# Patient Record
Sex: Male | Born: 1940 | Race: White | Hispanic: No | Marital: Single | Smoking: Never smoker
Health system: Southern US, Community
[De-identification: ages and names within clinical notes are randomized; demographics above are authoritative.]

## PROBLEM LIST (undated history)

## (undated) DIAGNOSIS — M792 Neuralgia and neuritis, unspecified: Secondary | ICD-10-CM

## (undated) DIAGNOSIS — I10 Essential (primary) hypertension: Secondary | ICD-10-CM

## (undated) DIAGNOSIS — M1990 Unspecified osteoarthritis, unspecified site: Secondary | ICD-10-CM

## (undated) DIAGNOSIS — M5030 Other cervical disc degeneration, unspecified cervical region: Secondary | ICD-10-CM

## (undated) DIAGNOSIS — S065X9A Traumatic subdural hemorrhage with loss of consciousness of unspecified duration, initial encounter: Secondary | ICD-10-CM

## (undated) DIAGNOSIS — I639 Cerebral infarction, unspecified: Secondary | ICD-10-CM

## (undated) DIAGNOSIS — E785 Hyperlipidemia, unspecified: Secondary | ICD-10-CM

## (undated) HISTORY — DX: Hyperlipidemia, unspecified: E78.5

## (undated) HISTORY — DX: Cerebral infarction, unspecified: I63.9

## (undated) HISTORY — PX: NASAL SINUS SURGERY: SHX719

## (undated) HISTORY — PX: LAMINECTOMY: SHX219

## (undated) HISTORY — DX: Other cervical disc degeneration, unspecified cervical region: M50.30

## (undated) HISTORY — DX: Traumatic subdural hemorrhage with loss of consciousness of unspecified duration, initial encounter: S06.5X9A

## (undated) HISTORY — PX: FOOT SURGERY: SHX648

## (undated) HISTORY — DX: Unspecified osteoarthritis, unspecified site: M19.90

---

## 2011-10-27 DIAGNOSIS — S065X9A Traumatic subdural hemorrhage with loss of consciousness of unspecified duration, initial encounter: Secondary | ICD-10-CM

## 2011-10-27 DIAGNOSIS — S065XAA Traumatic subdural hemorrhage with loss of consciousness status unknown, initial encounter: Secondary | ICD-10-CM

## 2011-10-27 HISTORY — DX: Traumatic subdural hemorrhage with loss of consciousness of unspecified duration, initial encounter: S06.5X9A

## 2011-10-27 HISTORY — DX: Traumatic subdural hemorrhage with loss of consciousness status unknown, initial encounter: S06.5XAA

## 2011-11-14 ENCOUNTER — Inpatient Hospital Stay (HOSPITAL_COMMUNITY)
Admission: AD | Admit: 2011-11-14 | Discharge: 2011-11-21 | DRG: 027 | Disposition: A | Discharge status: Home / Self Care | Payer: Medicare Other | Source: Other Acute Inpatient Hospital | Attending: Neurosurgery | Admitting: Neurosurgery

## 2011-11-14 ENCOUNTER — Inpatient Hospital Stay (HOSPITAL_COMMUNITY): Payer: Medicare Other

## 2011-11-14 ENCOUNTER — Encounter (HOSPITAL_COMMUNITY): Payer: Self-pay | Admitting: *Deleted

## 2011-11-14 DIAGNOSIS — I10 Essential (primary) hypertension: Secondary | ICD-10-CM | POA: Diagnosis present

## 2011-11-14 DIAGNOSIS — Z79899 Other long term (current) drug therapy: Secondary | ICD-10-CM

## 2011-11-14 DIAGNOSIS — I6200 Nontraumatic subdural hemorrhage, unspecified: Principal | ICD-10-CM | POA: Diagnosis present

## 2011-11-14 HISTORY — DX: Neuralgia and neuritis, unspecified: M79.2

## 2011-11-14 HISTORY — DX: Essential (primary) hypertension: I10

## 2011-11-14 LAB — COMPREHENSIVE METABOLIC PANEL
ALT: 17 U/L (ref 0–53)
Alkaline Phosphatase: 82 U/L (ref 39–117)
BUN: 13 mg/dL (ref 6–23)
CO2: 23 mEq/L (ref 19–32)
Calcium: 9.3 mg/dL (ref 8.4–10.5)
GFR calc Af Amer: >90 mL/min (ref >90)
GFR calc non Af Amer: 84 mL/min — ABNORMAL LOW (ref >90)
Glucose, Bld: 100 mg/dL — ABNORMAL HIGH (ref 70–99)
Sodium: 140 mEq/L (ref 135–145)
Total Protein: 6.9 g/dL (ref 6.0–8.3)

## 2011-11-14 LAB — PROTIME-INR: Prothrombin Time: 14.7 seconds (ref 11.6–15.2)

## 2011-11-14 LAB — CBC
MCH: 31.4 pg (ref 26.0–34.0)
MCHC: 34.7 g/dL (ref 30.0–36.0)
MCV: 90.3 fL (ref 78.0–100.0)
Platelets: 213 10*3/uL (ref 150–400)
RBC: 4.97 MIL/uL (ref 4.22–5.81)

## 2011-11-14 MED ORDER — ADULT MULTIVITAMIN W/MINERALS CH
1.0000 | ORAL_TABLET | Freq: Every day | ORAL | Status: DC
  Administered 2011-11-15 – 2011-11-21 (×7): 1 via ORAL
  Filled 2011-11-14 (×7): qty 1

## 2011-11-14 MED ORDER — GABAPENTIN 300 MG PO CAPS
300.0000 mg | ORAL_CAPSULE | Freq: Every day | ORAL | Status: DC
  Administered 2011-11-15 – 2011-11-21 (×7): 300 mg via ORAL
  Filled 2011-11-14 (×7): qty 1

## 2011-11-14 MED ORDER — MORPHINE SULFATE 2 MG/ML IJ SOLN
1.0000 mg | INTRAMUSCULAR | Status: DC | PRN
  Administered 2011-11-14 – 2011-11-15 (×2): 2 mg via INTRAVENOUS
  Filled 2011-11-14 (×2): qty 1

## 2011-11-14 MED ORDER — SODIUM CHLORIDE 0.9 % IV SOLN
INTRAVENOUS | Status: DC
  Administered 2011-11-14: 21:00:00 via INTRAVENOUS

## 2011-11-14 MED ORDER — ACETAMINOPHEN 650 MG RE SUPP: 650.0000 mg | RECTAL | Status: DC | PRN

## 2011-11-14 MED ORDER — ACETAMINOPHEN 325 MG PO TABS: 650.0000 mg | ORAL_TABLET | ORAL | Status: DC | PRN

## 2011-11-14 MED ORDER — LISINOPRIL 5 MG PO TABS
5.0000 mg | ORAL_TABLET | Freq: Every day | ORAL | Status: DC
  Administered 2011-11-15 – 2011-11-21 (×5): 5 mg via ORAL
  Filled 2011-11-14 (×7): qty 1

## 2011-11-14 NOTE — H&P (Signed)
Subjective: 71 yr old man with few days history of confusion - presented to ER - found to have bilateral SBH - no history of trauma - has had couple falls and dropping things from right hand last 2 days, also.  There are no active problems to display for this patient.  Past Medical History  Diagnosis Date  . Hypertension   . Nerve pain     History reviewed. No pertinent past surgical history.  No prescriptions prior to admission   No Known Allergies  History  Substance Use Topics  . Smoking status: Never Smoker   . Smokeless tobacco: Not on file  . Alcohol Use: Yes    History reviewed. No pertinent family history.  Review of Systems A comprehensive review of systems was negative.  Objective: Vital signs in last 24 hours:   normocepahalic AAOx3,  Follows most commands, but some confusion with multistep commands - some word confusion.   Motor - 5/5, no drift  CN II-XII intact , gait defered  Data ReviewCT head - bilat isodense SDH  Assessment/Plan: Pt with large bilaterall subacute SDH - admit to ICU - to OR for evacuation of SDH - procedure and possible risks and complications discussed with pt, including death, stroke , bleeding, infection, recurrence, among others.   Clydene Fake, MD 11/14/2011 8:30 PM

## 2011-11-15 ENCOUNTER — Encounter (HOSPITAL_COMMUNITY): Payer: Self-pay | Admitting: Anesthesiology

## 2011-11-15 ENCOUNTER — Inpatient Hospital Stay (HOSPITAL_COMMUNITY): Payer: Medicare Other | Admitting: Anesthesiology

## 2011-11-15 ENCOUNTER — Encounter (HOSPITAL_COMMUNITY)
Admission: AD | Disposition: A | Discharge status: Home / Self Care | Payer: Self-pay | Source: Other Acute Inpatient Hospital | Attending: Neurosurgery

## 2011-11-15 HISTORY — PX: CRANIOTOMY: SHX93

## 2011-11-15 LAB — URINALYSIS, ROUTINE W REFLEX MICROSCOPIC
Bilirubin Urine: NEGATIVE
Ketones, ur: 40 mg/dL — AB
Leukocytes, UA: NEGATIVE
Nitrite: NEGATIVE
Specific Gravity, Urine: 1.019 (ref 1.005–1.030)
Urobilinogen, UA: 0.2 mg/dL (ref 0.0–1.0)

## 2011-11-15 LAB — ABO/RH: ABO/RH(D): AB NEG

## 2011-11-15 SURGERY — CRANIOTOMY HEMATOMA EVACUATION SUBDURAL
Anesthesia: General | Site: Head | Wound class: Clean

## 2011-11-15 MED ORDER — LIDOCAINE-EPINEPHRINE 1 %-1:100000 IJ SOLN
INTRAMUSCULAR | Status: DC | PRN
  Administered 2011-11-15: 32.5 mL

## 2011-11-15 MED ORDER — SODIUM CHLORIDE 0.9 % IV SOLN
INTRAVENOUS | Status: DC | PRN
  Administered 2011-11-15: 11:00:00 via INTRAVENOUS

## 2011-11-15 MED ORDER — FLEET ENEMA 7-19 GM/118ML RE ENEM: 1.0000 | ENEMA | Freq: Once | RECTAL | Status: AC | PRN

## 2011-11-15 MED ORDER — ONDANSETRON HCL 4 MG/2ML IJ SOLN: 4.0000 mg | Freq: Four times a day (QID) | INTRAMUSCULAR | Status: DC | PRN

## 2011-11-15 MED ORDER — CEFAZOLIN SODIUM 1-5 GM-% IV SOLN
1.0000 g | Freq: Three times a day (TID) | INTRAVENOUS | Status: DC
  Administered 2011-11-15 – 2011-11-19 (×13): 1 g via INTRAVENOUS
  Filled 2011-11-15 (×14): qty 50

## 2011-11-15 MED ORDER — VECURONIUM BROMIDE 10 MG IV SOLR
INTRAVENOUS | Status: DC | PRN
  Administered 2011-11-15: 2 mg via INTRAVENOUS

## 2011-11-15 MED ORDER — HYDROCODONE-ACETAMINOPHEN 5-325 MG PO TABS
1.0000 | ORAL_TABLET | ORAL | Status: DC | PRN
  Administered 2011-11-15 – 2011-11-20 (×6): 1 via ORAL
  Filled 2011-11-15 (×6): qty 1

## 2011-11-15 MED ORDER — PHENYLEPHRINE HCL 10 MG/ML IJ SOLN
10.0000 mg | INTRAVENOUS | Status: DC | PRN
  Administered 2011-11-15: 1 ug/min via INTRAVENOUS

## 2011-11-15 MED ORDER — PROPOFOL 10 MG/ML IV BOLUS
INTRAVENOUS | Status: DC | PRN
  Administered 2011-11-15: 150 mg via INTRAVENOUS

## 2011-11-15 MED ORDER — DOCUSATE SODIUM 100 MG PO CAPS
100.0000 mg | ORAL_CAPSULE | Freq: Two times a day (BID) | ORAL | Status: DC
  Administered 2011-11-15 – 2011-11-20 (×10): 100 mg via ORAL
  Filled 2011-11-15 (×11): qty 1

## 2011-11-15 MED ORDER — HYDROMORPHONE HCL PF 1 MG/ML IJ SOLN
0.2500 mg | INTRAMUSCULAR | Status: DC | PRN
  Administered 2011-11-15: 0.5 mg via INTRAVENOUS

## 2011-11-15 MED ORDER — ONDANSETRON HCL 4 MG PO TABS: 4.0000 mg | ORAL_TABLET | ORAL | Status: DC | PRN

## 2011-11-15 MED ORDER — MORPHINE SULFATE 2 MG/ML IJ SOLN: 1.0000 mg | INTRAMUSCULAR | Status: DC | PRN

## 2011-11-15 MED ORDER — ONDANSETRON HCL 4 MG/2ML IJ SOLN: 4.0000 mg | INTRAMUSCULAR | Status: DC | PRN

## 2011-11-15 MED ORDER — SODIUM CHLORIDE 0.9 % IV SOLN
500.0000 mg | Freq: Two times a day (BID) | INTRAVENOUS | Status: DC
  Administered 2011-11-15 – 2011-11-16 (×3): 500 mg via INTRAVENOUS
  Filled 2011-11-15 (×4): qty 5

## 2011-11-15 MED ORDER — THROMBIN 20000 UNITS EX KIT
PACK | CUTANEOUS | Status: DC | PRN
  Administered 2011-11-15: 12:00:00 via TOPICAL

## 2011-11-15 MED ORDER — NEOSTIGMINE METHYLSULFATE 1 MG/ML IJ SOLN
INTRAMUSCULAR | Status: DC | PRN
  Administered 2011-11-15: 4 mg via INTRAVENOUS

## 2011-11-15 MED ORDER — LABETALOL HCL 5 MG/ML IV SOLN
INTRAVENOUS | Status: DC | PRN
  Administered 2011-11-15 (×2): 10 mg via INTRAVENOUS

## 2011-11-15 MED ORDER — LABETALOL HCL 5 MG/ML IV SOLN: 10.0000 mg | INTRAVENOUS | Status: DC | PRN

## 2011-11-15 MED ORDER — MICROFIBRILLAR COLL HEMOSTAT EX PADS
MEDICATED_PAD | CUTANEOUS | Status: DC | PRN
  Administered 2011-11-15: 1 via TOPICAL

## 2011-11-15 MED ORDER — FENTANYL CITRATE 0.05 MG/ML IJ SOLN
INTRAMUSCULAR | Status: DC | PRN
  Administered 2011-11-15: 25 ug via INTRAVENOUS
  Administered 2011-11-15: 50 ug via INTRAVENOUS
  Administered 2011-11-15: 25 ug via INTRAVENOUS

## 2011-11-15 MED ORDER — ACETAMINOPHEN 325 MG PO TABS
650.0000 mg | ORAL_TABLET | ORAL | Status: DC | PRN
  Administered 2011-11-17 – 2011-11-20 (×6): 650 mg via ORAL
  Filled 2011-11-15 (×6): qty 2

## 2011-11-15 MED ORDER — GLYCOPYRROLATE 0.2 MG/ML IJ SOLN
INTRAMUSCULAR | Status: DC | PRN
  Administered 2011-11-15: .8 mg via INTRAVENOUS

## 2011-11-15 MED ORDER — ACETAMINOPHEN 650 MG RE SUPP: 650.0000 mg | RECTAL | Status: DC | PRN

## 2011-11-15 MED ORDER — KCL IN DEXTROSE-NACL 20-5-0.45 MEQ/L-%-% IV SOLN
INTRAVENOUS | Status: DC
  Administered 2011-11-15 – 2011-11-16 (×3): via INTRAVENOUS
  Filled 2011-11-15 (×6): qty 1000

## 2011-11-15 MED ORDER — ROCURONIUM BROMIDE 100 MG/10ML IV SOLN
INTRAVENOUS | Status: DC | PRN
  Administered 2011-11-15: 50 mg via INTRAVENOUS

## 2011-11-15 MED ORDER — BISACODYL 10 MG RE SUPP: 10.0000 mg | Freq: Every day | RECTAL | Status: DC | PRN

## 2011-11-15 MED ORDER — PANTOPRAZOLE SODIUM 40 MG IV SOLR
40.0000 mg | Freq: Every day | INTRAVENOUS | Status: DC
  Administered 2011-11-15 – 2011-11-17 (×3): 40 mg via INTRAVENOUS
  Filled 2011-11-15 (×4): qty 40

## 2011-11-15 MED ORDER — CEFAZOLIN SODIUM 1-5 GM-% IV SOLN
INTRAVENOUS | Status: AC
  Administered 2011-11-15: 1 g via INTRAVENOUS
  Filled 2011-11-15: qty 50

## 2011-11-15 MED ORDER — ONDANSETRON HCL 4 MG/2ML IJ SOLN
INTRAMUSCULAR | Status: DC | PRN
  Administered 2011-11-15: 4 mg via INTRAVENOUS

## 2011-11-15 MED ORDER — PROMETHAZINE HCL 25 MG PO TABS: 12.5000 mg | ORAL_TABLET | ORAL | Status: DC | PRN

## 2011-11-15 MED ORDER — MAGNESIUM HYDROXIDE 400 MG/5ML PO SUSP: 30.0000 mL | Freq: Every day | ORAL | Status: DC | PRN

## 2011-11-15 SURGICAL SUPPLY — 68 items
BAG DECANTER FOR FLEXI CONT (MISCELLANEOUS) ×2 IMPLANT
BANDAGE GAUZE 4  KLING STR (GAUZE/BANDAGES/DRESSINGS) IMPLANT
BANDAGE GAUZE ELAST BULKY 4 IN (GAUZE/BANDAGES/DRESSINGS) IMPLANT
BIT DRILL WIRE PASS 1.3MM (BIT) IMPLANT
BLADE CLIPPER SURG NEURO (BLADE) IMPLANT
BRUSH SCRUB EZ PLAIN DRY (MISCELLANEOUS) ×1 IMPLANT
BUR ACORN 6.0 PRECISION (BURR) ×2 IMPLANT
BUR ROUTER D-58 CRANI (BURR) IMPLANT
CANISTER SUCTION 2500CC (MISCELLANEOUS) ×2 IMPLANT
CLIP TI MEDIUM 6 (CLIP) IMPLANT
CLOTH BEACON ORANGE TIMEOUT ST (SAFETY) ×2 IMPLANT
CONT SPEC 4OZ CLIKSEAL STRL BL (MISCELLANEOUS) ×2 IMPLANT
CORDS BIPOLAR (ELECTRODE) ×2 IMPLANT
DRAIN JACKSON PRATT 10MM FLAT (MISCELLANEOUS) ×2 IMPLANT
DRAPE NEUROLOGICAL W/INCISE (DRAPES) ×2 IMPLANT
DRAPE SURG 17X23 STRL (DRAPES) IMPLANT
DRAPE WARM FLUID 44X44 (DRAPE) ×2 IMPLANT
DRESSING TELFA 8X3 (GAUZE/BANDAGES/DRESSINGS) ×1 IMPLANT
DRILL WIRE PASS 1.3MM (BIT)
DRSG OPSITE 4X5.5 SM (GAUZE/BANDAGES/DRESSINGS) ×4 IMPLANT
ELECT CAUTERY BLADE 6.4 (BLADE) ×2 IMPLANT
ELECT NDL TIP 2.8 STRL (NEEDLE) ×1 IMPLANT
ELECT NEEDLE TIP 2.8 STRL (NEEDLE) ×2 IMPLANT
ELECT REM PT RETURN 9FT ADLT (ELECTROSURGICAL) ×2
ELECTRODE REM PT RTRN 9FT ADLT (ELECTROSURGICAL) ×1 IMPLANT
EVACUATOR SILICONE 100CC (DRAIN) ×2 IMPLANT
GAUZE SPONGE 4X4 16PLY XRAY LF (GAUZE/BANDAGES/DRESSINGS) IMPLANT
GLOVE BIOGEL M 7.0 STRL (GLOVE) ×1 IMPLANT
GLOVE BIOGEL M 8.0 STRL (GLOVE) ×1 IMPLANT
GLOVE ECLIPSE 7.5 STRL STRAW (GLOVE) ×2 IMPLANT
GLOVE EXAM NITRILE LRG STRL (GLOVE) IMPLANT
GLOVE EXAM NITRILE MD LF STRL (GLOVE) ×1 IMPLANT
GLOVE EXAM NITRILE XL STR (GLOVE) IMPLANT
GLOVE EXAM NITRILE XS STR PU (GLOVE) IMPLANT
GLOVE INDICATOR 7.5 STRL GRN (GLOVE) ×1 IMPLANT
GOWN BRE IMP SLV AUR LG STRL (GOWN DISPOSABLE) ×3 IMPLANT
GOWN BRE IMP SLV AUR XL STRL (GOWN DISPOSABLE) ×1 IMPLANT
GOWN STRL REIN 2XL LVL4 (GOWN DISPOSABLE) IMPLANT
HOOK DURA (MISCELLANEOUS) ×1 IMPLANT
KIT BASIN OR (CUSTOM PROCEDURE TRAY) ×2 IMPLANT
KIT ROOM TURNOVER OR (KITS) ×2 IMPLANT
NEEDLE HYPO 22GX1.5 SAFETY (NEEDLE) ×2 IMPLANT
NS IRRIG 1000ML POUR BTL (IV SOLUTION) ×3 IMPLANT
PACK CRANIOTOMY (CUSTOM PROCEDURE TRAY) ×2 IMPLANT
PAD ARMBOARD 7.5X6 YLW CONV (MISCELLANEOUS) ×4 IMPLANT
PATTIES SURGICAL .25X.25 (GAUZE/BANDAGES/DRESSINGS) IMPLANT
PATTIES SURGICAL .5 X.5 (GAUZE/BANDAGES/DRESSINGS) IMPLANT
PATTIES SURGICAL .5 X3 (DISPOSABLE) IMPLANT
PATTIES SURGICAL .75X.75 (GAUZE/BANDAGES/DRESSINGS) ×2 IMPLANT
PATTIES SURGICAL 1X1 (DISPOSABLE) IMPLANT
PERFORATOR LRG  14-11MM (BIT) ×1
PERFORATOR LRG 14-11MM (BIT) IMPLANT
PIN MAYFIELD SKULL DISP (PIN) IMPLANT
SPECIMEN JAR SMALL (MISCELLANEOUS) IMPLANT
SPONGE GAUZE 4X4 12PLY (GAUZE/BANDAGES/DRESSINGS) IMPLANT
SPONGE NEURO XRAY DETECT 1X3 (DISPOSABLE) IMPLANT
STAPLER SKIN PROX WIDE 3.9 (STAPLE) ×2 IMPLANT
SUT ETHILON 3 0 FSL (SUTURE) IMPLANT
SUT NURALON 4 0 TR CR/8 (SUTURE) ×4 IMPLANT
SUT VIC AB 2-0 CP2 18 (SUTURE) ×4 IMPLANT
SYR 20ML ECCENTRIC (SYRINGE) ×2 IMPLANT
SYR CONTROL 10ML LL (SYRINGE) ×2 IMPLANT
TAPE PAPER MEDFIX 1IN X 10YD (GAUZE/BANDAGES/DRESSINGS) IMPLANT
TOWEL OR 17X24 6PK STRL BLUE (TOWEL DISPOSABLE) ×1 IMPLANT
TOWEL OR 17X26 10 PK STRL BLUE (TOWEL DISPOSABLE) ×2 IMPLANT
TRAY FOLEY CATH 14FRSI W/METER (CATHETERS) ×1 IMPLANT
UNDERPAD 30X30 INCONTINENT (UNDERPADS AND DIAPERS) ×1 IMPLANT
WATER STERILE IRR 1000ML POUR (IV SOLUTION) ×2 IMPLANT

## 2011-11-15 NOTE — Anesthesia Preprocedure Evaluation (Signed)
Anesthesia Evaluation  Patient identified by MRN, date of birth, ID band Patient awake    Reviewed: Allergy & Precautions, H&P , NPO status , Patient's Chart, lab work & pertinent test results  Airway Mallampati: II  Neck ROM: full    Dental   Pulmonary          Cardiovascular hypertension,     Neuro/Psych  Neuromuscular disease    GI/Hepatic   Endo/Other    Renal/GU      Musculoskeletal   Abdominal   Peds  Hematology   Anesthesia Other Findings   Reproductive/Obstetrics                           Anesthesia Physical Anesthesia Plan  ASA: II  Anesthesia Plan: General   Post-op Pain Management:    Induction: Intravenous  Airway Management Planned: Oral ETT  Additional Equipment:   Intra-op Plan:   Post-operative Plan: Extubation in OR  Informed Consent: I have reviewed the patients History and Physical, chart, labs and discussed the procedure including the risks, benefits and alternatives for the proposed anesthesia with the patient or authorized representative who has indicated his/her understanding and acceptance.     Plan Discussed with: CRNA and Surgeon  Anesthesia Plan Comments:         Anesthesia Quick Evaluation

## 2011-11-15 NOTE — Preoperative (Signed)
Beta Blockers   Reason not to administer Beta Blockers:Not Applicable 

## 2011-11-15 NOTE — Anesthesia Postprocedure Evaluation (Signed)
Anesthesia Post Note  Patient: Johnathan Reynolds  Procedure(s) Performed: Procedure(s) (LRB): CRANIOTOMY HEMATOMA EVACUATION SUBDURAL (N/A)  Anesthesia type: General  Patient location: PACU  Post pain: Pain level controlled and Adequate analgesia  Post assessment: Post-op Vital signs reviewed, Patient's Cardiovascular Status Stable, Respiratory Function Stable, Patent Airway and Pain level controlled  Last Vitals:  Filed Vitals:   11/15/11 1000  BP:   Pulse: 77  Temp:   Resp: 15    Post vital signs: Reviewed and stable  Level of consciousness: awake, alert  and oriented  Complications: No apparent anesthesia complications

## 2011-11-15 NOTE — Transfer of Care (Signed)
Immediate Anesthesia Transfer of Care Note  Patient: Johnathan Reynolds  Procedure(s) Performed: Procedure(s) (LRB): CRANIOTOMY HEMATOMA EVACUATION SUBDURAL (N/A)  Patient Location: PACU  Anesthesia Type: General  Level of Consciousness: awake and alert   Airway & Oxygen Therapy: Patient Spontanous Breathing and Patient connected to nasal cannula oxygen  Post-op Assessment: Report given to PACU RN and Post -op Vital signs reviewed and stable  Post vital signs: Reviewed and stable  Complications: No apparent anesthesia complications

## 2011-11-15 NOTE — Op Note (Signed)
11/14/2011 - 11/15/2011  12:38 PM  PATIENT:  Johnathan Reynolds  71 y.o. male  PRE-OPERATIVE DIAGNOSIS:  subdural hematoma, bilateral  POST-OPERATIVE DIAGNOSIS:  subdural hematoma, bilateral  PROCEDURE:  Procedure(s): Bilateral burr hole evacuation SUBDURAL hematoma  SURGEON:  Surgeon(s): Clydene Fake, MD Karn Cassis, MD-ASSISTANT    ANESTHESIA:   general  EBL:  Total I/O In: 325 [I.V.:325] Out: -   BLOOD ADMINISTERED:none  DRAINS: () Jackson-Pratt drain(s) with closed bulb suction in the subgaleal space   SPECIMEN:  No Specimen  DICTATION: Patient has been having confusion some weakness CT done showing bilateral isodense subdural hematomas patient brought in for evacuation of hematoma.  Patient brought in the operating room general anesthesia induced patient was prepped draped sterile fashion. 4 incisions were made paramedian to on the right than the left one anterior one posterior taken down to the skull hemostasis obtained with Bovie cauterization. 4 bur holes were made in each of the incisions. Dura was coagulated with bipolar and dura opened with a 15 blade and the dural leaves Cora unit with bipolar. Dark blood was came out term CT to the little holes and we irrigated until the fluid was clear. Neck in the well-defined inner membrane which reopened with a microhook and each of the spots and then irrigated further of fluid rehydration under the inner membrane is remote to irrigated out and blood products. JP drain was brought out when he decides her substernal incision the JP drains were left in the subgaleal space over the bur holes. Galea closed with 2-0 Vicryl interrupted sutures the 4 spots skin closed with staples as sutures used to anchor the drains drinker connected to JP bulbs dressing was placed patient (and transferred to recovery.  PLAN OF CARE: Admit to inpatient   PATIENT DISPOSITION:  ICU - extubated and stable.

## 2011-11-16 MED ORDER — LEVETIRACETAM 500 MG PO TABS
500.0000 mg | ORAL_TABLET | Freq: Two times a day (BID) | ORAL | Status: DC
  Administered 2011-11-16 – 2011-11-21 (×10): 500 mg via ORAL
  Filled 2011-11-16 (×11): qty 1

## 2011-11-16 NOTE — Progress Notes (Signed)
Subjective: Patient reports no problems  Objective: Vital signs in last 24 hours: Temp:  [97 F (36.1 C)-98.1 F (36.7 C)] 97.7 F (36.5 C) (04/21 0410) Pulse Rate:  [42-83] 60  (04/21 0600) Resp:  [10-24] 10  (04/21 0600) BP: (96-145)/(56-81) 105/63 mmHg (04/21 0600) SpO2:  [94 %-100 %] 96 % (04/21 0600) Arterial Line BP: (120-168)/(54-90) 139/54 mmHg (04/20 1700) Weight:  [74.6 kg (164 lb 7.4 oz)] 74.6 kg (164 lb 7.4 oz) (04/21 0500)  Intake/Output from previous day: 04/20 0701 - 04/21 0700 In: 4433.3 [P.O.:560; I.V.:3438.3; IV Piggyback:310] Out: 3030 [Urine:2790; Drains:240] Intake/Output this shift: Total I/O In: 1466.7 [P.O.:240; I.V.:1071.7; IV Piggyback:155] Out: 2135 [Urine:2025; Drains:110]  PE - AAOx3, FC all 4 and moves all 4 well, seems improved mentation from preop, speech fluent Incisions - c/d/i  Lab Results:  Basename 11/14/11 2021  WBC 6.0  HGB 15.6  HCT 44.9  PLT 213   BMET  Basename 11/14/11 2021  NA 140  K 4.2  CL 107  CO2 23  GLUCOSE 100*  BUN 13  CREATININE 0.88  CALCIUM 9.3    Studies/Results: Dg Chest 2 View  11/15/2011  *RADIOLOGY REPORT*  Clinical Data: Preop  CHEST - 2 VIEW  Comparison: None.  Findings: Lungs are clear. No pleural effusion or pneumothorax.  Cardiomediastinal silhouette is within normal limits.  Degenerative changes of the visualized thoracolumbar spine.  IMPRESSION: No evidence of acute cardiopulmonary disease.  Original Report Authenticated By: Charline Bills, M.D.    Assessment/Plan: Pt doing well post-op - bedrest at least another day  LOS: 2 days     Clydene Fake, MD 11/16/2011, 6:56 AM

## 2011-11-16 NOTE — Plan of Care (Signed)
Problem: Consults Goal: Diagnosis - Craniotomy Outcome: Completed/Met Date Met:  11/16/11 SDH

## 2011-11-17 ENCOUNTER — Encounter (HOSPITAL_COMMUNITY): Payer: Self-pay | Admitting: Neurosurgery

## 2011-11-17 MED FILL — Hydromorphone HCl Inj 1 MG/ML: INTRAMUSCULAR | Qty: 1 | Status: AC

## 2011-11-17 NOTE — Progress Notes (Signed)
Subjective: Patient reports no c/o  Objective: Vital signs in last 24 hours: Temp:  [97.5 F (36.4 C)-98.6 F (37 C)] 97.9 F (36.6 C) (04/22 0700) Pulse Rate:  [52-75] 75  (04/22 1000) Resp:  [11-18] 14  (04/22 1000) BP: (107-134)/(55-87) 124/71 mmHg (04/22 1000) SpO2:  [95 %-100 %] 98 % (04/22 1000) Weight:  [73.3 kg (161 lb 9.6 oz)] 73.3 kg (161 lb 9.6 oz) (04/22 0400)  Intake/Output from previous day: 04/21 0701 - 04/22 0700 In: 2208.3 [P.O.:950; I.V.:1003.3; IV Piggyback:255] Out: 3265 [Urine:2955; Drains:310] Intake/Output this shift: Total I/O In: 20 [I.V.:20] Out: 475 [Urine:425; Drains:50]  Wound:c/d/i , drains working - intact Neuro  - no drift, AAOx3  Lab Results:  Basename 11/14/11 2021  WBC 6.0  HGB 15.6  HCT 44.9  PLT 213   BMET  Basename 11/14/11 2021  NA 140  K 4.2  CL 107  CO2 23  GLUCOSE 100*  BUN 13  CREATININE 0.88  CALCIUM 9.3    Studies/Results: No results found.  Assessment/Plan: Pt stavble - increase activity ,   LOS: 3 days     Jarissa Sheriff R, MD 11/17/2011, 10:58 AM

## 2011-11-18 LAB — TYPE AND SCREEN
Antibody Screen: NEGATIVE
Unit division: 0

## 2011-11-18 MED ORDER — PANTOPRAZOLE SODIUM 40 MG PO TBEC
40.0000 mg | DELAYED_RELEASE_TABLET | Freq: Every day | ORAL | Status: DC
  Administered 2011-11-18 – 2011-11-21 (×4): 40 mg via ORAL
  Filled 2011-11-18 (×5): qty 1

## 2011-11-18 NOTE — Progress Notes (Signed)
Utilization review completed. Jasenia Weilbacher, RN, BSN.  11/18/11  

## 2011-11-18 NOTE — Progress Notes (Signed)
Subjective: Patient has been uip in room, no c/o  Objective: Vital signs in last 24 hours: Temp:  [97.1 F (36.2 C)-98.4 F (36.9 C)] 97.5 F (36.4 C) (04/23 0800) Pulse Rate:  [54-105] 65  (04/23 1100) Resp:  [13-22] 15  (04/23 1100) BP: (85-132)/(58-92) 122/83 mmHg (04/23 1000) SpO2:  [93 %-100 %] 97 % (04/23 1100) Weight:  [72 kg (158 lb 11.7 oz)] 72 kg (158 lb 11.7 oz) (04/23 0600)  Intake/Output from previous day: 04/22 0701 - 04/23 0700 In: 482 [I.V.:240; IV Piggyback:150] Out: 2585 [Urine:2450; Drains:135] Intake/Output this shift: Total I/O In: 80 [I.V.:80] Out: 175 [Urine:175]  Wound:c/d/i -  Drains removed  Lab Results: No results found for this basename: WBC:2,HGB:2,HCT:2,PLT:2 in the last 72 hours BMET No results found for this basename: NA:2,K:2,CL:2,CO2:2,GLUCOSE:2,BUN:2,CREATININE:2,CALCIUM:2 in the last 72 hours  Studies/Results: No results found.  Assessment/Plan: Pt doing well  - increase activity - CT in am, Trans to floor in am?  LOS: 4 days     Johnathan Reynolds R, MD 11/18/2011, 11:17 AM

## 2011-11-19 ENCOUNTER — Encounter (HOSPITAL_COMMUNITY): Payer: Self-pay | Admitting: Radiology

## 2011-11-19 ENCOUNTER — Inpatient Hospital Stay (HOSPITAL_COMMUNITY): Payer: Medicare Other

## 2011-11-19 NOTE — Evaluation (Signed)
Physical Therapy Evaluation Patient Details Name: Johnathan Reynolds MRN: [id number] DOB: 05/09/1941 Today's Date: 11/19/2011 Time: 4098-1191 PT Time Calculation (min): 33 min  PT Assessment / Plan / Recommendation Clinical Impression  Pt is a 71 y.o. male admitted s/p a carniotomy for evacuation of a subdural hematoma with resulting decreased mobility, impaired balance, and impaired ambulation.  Patient will benefit from skilled physical therapy in the acute setting to address these impairments and prepare for d/c home with assist from family.    PT Assessment  Patient needs continued PT services    Follow Up Recommendations  Supervision/Assistance - 24 hour    Equipment Recommendations  None recommended by PT    Frequency Min 4X/week    Precautions / Restrictions Precautions Precautions: Fall Restrictions Weight Bearing Restrictions: No   Pertinent Vitals/Pain       Mobility  Bed Mobility Bed Mobility: Not assessed (Pt up in chair upon OT arrival) Transfers Transfers: Sit to Stand;Stand to Sit Sit to Stand: 5: Supervision;From chair/3-in-1;With armrests;With upper extremity assist Stand to Sit: 5: Supervision;With armrests;With upper extremity assist;To chair/3-in-1 Details for Transfer Assistance: Pt required supervision for safety with VC for safe hand placement. Ambulation/Gait Ambulation/Gait Assistance: 4: Min guard Ambulation Distance (Feet): 150 Feet Assistive device: None Ambulation/Gait Assistance Details: Pt required min guard assist for safety.  Pt is fearful/apprehensive about falling and takes slow, small steps. Gait Pattern: Step-to pattern;Decreased stride length;Shuffle;Narrow base of support Gait velocity: Decreased Stairs: No    Exercises     PT Goals Acute Rehab PT Goals PT Goal Formulation: With patient Time For Goal Achievement: 11/26/11 Potential to Achieve Goals: Good Pt will go Supine/Side to Sit: Independently PT Goal: Supine/Side to Sit -  Progress: Goal set today Pt will go Sit to Supine/Side: Independently PT Goal: Sit to Supine/Side - Progress: Goal set today Pt will go Sit to Stand: Independently PT Goal: Sit to Stand - Progress: Goal set today Pt will go Stand to Sit: Independently PT Goal: Stand to Sit - Progress: Goal set today Pt will Ambulate: >150 feet;with modified independence PT Goal: Ambulate - Progress: Goal set today  Visit Information  Last PT Received On: 11/19/11 Assistance Needed: +1 PT/OT Co-Evaluation/Treatment: Yes    Subjective Data  Subjective: 2/10 headache   Prior Functioning  Home Living Lives With: Alone Available Help at Discharge: Family;Available PRN/intermittently;Available 24 hours/day (nephew 24/7 initially) Type of Home: Mobile home Home Access: Ramped entrance Home Layout: One level Bathroom Shower/Tub: Tub/shower unit;Walk-in shower Bathroom Toilet: Standard Bathroom Accessibility: Yes How Accessible: Accessible via walker Home Adaptive Equipment: Shower chair without back;Bedside commode/3-in-1 Prior Function Level of Independence: Independent Able to Take Stairs?: Yes Driving: Yes Vocation: Retired Musician: No difficulties Dominant Hand: Right    Cognition  Overall Cognitive Status: Appears within functional limits for tasks assessed/performed Arousal/Alertness: Awake/alert Orientation Level: Oriented X4 / Intact Behavior During Session: WFL for tasks performed    Extremity/Trunk Assessment Right Upper Extremity Assessment RUE ROM/Strength/Tone: Within functional levels RUE Sensation: WFL - Light Touch;WFL - Proprioception RUE Coordination: WFL - gross/fine motor Left Upper Extremity Assessment LUE ROM/Strength/Tone: Within functional levels LUE Sensation: WFL - Light Touch;WFL - Proprioception LUE Coordination: WFL - gross/fine motor Right Lower Extremity Assessment RLE ROM/Strength/Tone: Within functional levels RLE Sensation: WFL -  Light Touch;WFL - Proprioception RLE Coordination: WFL - gross/fine motor Left Lower Extremity Assessment LLE ROM/Strength/Tone: Within functional levels LLE Sensation: WFL - Light Touch;WFL - Proprioception LLE Coordination: WFL - gross/fine motor Trunk Assessment Trunk  Assessment: Normal   Balance Balance Balance Assessed: Yes Static Sitting Balance Static Sitting - Balance Support: Bilateral upper extremity supported;Feet supported Static Sitting - Level of Assistance: 7: Independent Static Standing Balance Static Standing - Balance Support: During functional activity;No upper extremity supported Static Standing - Level of Assistance: Other (comment) (Min guard) Static Standing - Comment/# of Minutes: Pt required min guard assist for safety while brushing his teeth.  End of Session PT - End of Session Equipment Utilized During Treatment: Gait belt Activity Tolerance: Patient tolerated treatment well Patient left: in chair;with call bell/phone within reach Nurse Communication: Mobility status   Ezzard Standing SPT 11/19/2011, 2:37 PM

## 2011-11-19 NOTE — Progress Notes (Signed)
Subjective: Patient up incahair, eating no c/o  Objective: Vital signs in last 24 hours: Temp:  [97.5 F (36.4 C)-98.7 F (37.1 C)] 98.7 F (37.1 C) (04/24 0700) Pulse Rate:  [65-92] 82  (04/24 0800) Resp:  [13-20] 16  (04/24 0800) BP: (94-132)/(61-93) 120/84 mmHg (04/24 0800) SpO2:  [95 %-100 %] 97 % (04/24 0800)  Intake/Output from previous day: 04/23 0701 - 04/24 0700 In: 1230 [P.O.:600; I.V.:480; IV Piggyback:150] Out: 1540 [Urine:1475; Drains:65] Intake/Output this shift: Total I/O In: 20 [I.V.:20] Out: -   Wound:c/d/i AAOx3,   Lab Results: No results found for this basename: WBC:2,HGB:2,HCT:2,PLT:2 in the last 72 hours BMET No results found for this basename: NA:2,K:2,CL:2,CO2:2,GLUCOSE:2,BUN:2,CREATININE:2,CALCIUM:2 in the last 72 hours  Studies/Results: Ct Head Wo Contrast  11/19/2011  *RADIOLOGY REPORT*  Clinical Data: Follow-up surgery.  Subdural hematoma.  No acute complaints.  CT HEAD WITHOUT CONTRAST  Technique:  Contiguous axial images were obtained from the base of the skull through the vertex without contrast.  Comparison: None.  Findings: Bilateral subdural hematomas extending from the anterior frontal through to the posterior parietal regions.  Hematomas appear mostly low attenuation consistent with chronic hematomas with focal areas of increased attenuation consistent with acute on chronic hematomas.  Postoperative changes with bilateral frontal cranial ostomy is.  Gas collections in the subdural spaces and subcutaneous fat consistent with postoperative change.  Skin clips are present in the subcutaneous tissues.  Maximal depth of the right subdural hematoma is about 9 mm.  Depth of the left subdural hematoma is about 9 mm.  Mild mass effect with effacement of sulci. No midline shift.  Diffuse cerebral atrophy.  No ventricular dilatation.  Gray-white matter junctions are distinct.  Basal cisterns are not effaced.  Mucosal membrane thickening in the ethmoid air  cells and maxillary antra.  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  Original Report Authenticated By: Marlon Pel, M.D.    Assessment/Plan: CT shows residual SDcoll3ections as expected - will Transfer to floor , ?d/c in am  LOS: 5 days     Johnathan Reynolds R, MD 11/19/2011, 8:37 AM

## 2011-11-19 NOTE — Evaluation (Signed)
Agree with student PT evaluation.  Alwyn Cordner, PT DPT [phone]  

## 2011-11-19 NOTE — Progress Notes (Signed)
Chaplain Note:  Chaplain visited pt, who was awake and resting in bed.  Pt was in good spirits and pleased with the way he felt and positive about his recovery.  Pt was anticipating being moved out of NICU today.  Chaplain offered spiritual comfort and support for pt.  Pt expressed appreciation for chaplain support but at this time, had no specific spiritual needs.  Chaplain will follow up as needed.  11/19/11 1300  Clinical Encounter Type  Visited With Patient  Visit Type Initial;Spiritual support  Referral From Other (Comment) (Sheyna Pettibone-referral)  Spiritual Encounters  Spiritual Needs Emotional  Stress Factors  Patient Stress Factors Health changes  Family Stress Factors None identified (No family present at this time.)   Verdie Shire, chaplain resident [phone]

## 2011-11-19 NOTE — Progress Notes (Signed)
Occupational Therapy Evaluation Patient Details Name: Johnathan Reynolds MRN: [id number] DOB: 04-Aug-1940 Today's Date: 11/19/2011 Time: 8:21-8:28; 3086-5784 OT Time Calculation (min): 32 min  OT Assessment / Plan / Recommendation Clinical Impression  Pt admitted with subdural hematoma and s/p craniotomy hematoma evacuation subdural.  Will benefit from acute OT services to address below problem list in prep for d/c home with assist from family.   Home living information gathered from 8:21-8:28.  Pt's breakfast tray arrived, OT returned for remainder of eval from 9:33-9:58.  OT Assessment  Patient needs continued OT Services    Follow Up Recommendations  Supervision/Assistance - 24 hour    Equipment Recommendations  None recommended by OT    Frequency Min 2X/week    Precautions / Restrictions     Pertinent Vitals/Pain NA    ADL  Eating/Feeding: Performed;Modified independent Where Assessed - Eating/Feeding: Chair Grooming: Performed;Teeth care;Denture care;Other (comment) (min guard) Where Assessed - Grooming: Standing at sink Upper Body Bathing: Simulated;Set up Where Assessed - Upper Body Bathing: Sitting, chair Lower Body Bathing: Simulated;Supervision/safety Where Assessed - Lower Body Bathing: Sit to stand from chair Upper Body Dressing: Simulated;Set up Where Assessed - Upper Body Dressing: Sitting, chair Lower Body Dressing: Simulated;Supervision/safety Where Assessed - Lower Body Dressing: Sit to stand from chair Toilet Transfer: Performed;Other (comment) (min guard) Toilet Transfer Method: Proofreader: Regular height toilet Toileting - Clothing Manipulation: Performed;Supervision/safety Where Assessed - Toileting Clothing Manipulation: Standing Equipment Used:  (none) Ambulation Related to ADLs: Pt ambulated throughout room and unit with min guard assist and small steps.  Pt with increased time during ambulation reporting that he feels like he  needs to be cautious. ADL Comments: Educated pt to avoid bending at sink.     OT Goals Acute Rehab OT Goals OT Goal Formulation: With patient Time For Goal Achievement: 11/26/11 Potential to Achieve Goals: Good ADL Goals Pt Will Perform Grooming: with modified independence;Standing at sink ADL Goal: Grooming - Progress: Goal set today Pt Will Perform Upper Body Bathing: with modified independence;Sitting, edge of bed;Sitting, chair ADL Goal: Upper Body Bathing - Progress: Goal set today Pt Will Perform Lower Body Bathing: with modified independence;Sit to stand from chair;Sit to stand from bed ADL Goal: Lower Body Bathing - Progress: Goal set today Pt Will Perform Upper Body Dressing: with modified independence;Sitting, chair;Sitting, bed ADL Goal: Upper Body Dressing - Progress: Goal set today Pt Will Perform Lower Body Dressing: with modified independence;Sit to stand from chair;Sit to stand from bed ADL Goal: Lower Body Dressing - Progress: Goal set today Pt Will Transfer to Toilet: with modified independence;Ambulation;Regular height toilet ADL Goal: Toilet Transfer - Progress: Goal set today Pt Will Perform Toileting - Clothing Manipulation: with modified independence;Standing ADL Goal: Toileting - Clothing Manipulation - Progress: Goal set today Pt Will Perform Toileting - Hygiene: with modified independence;Sitting on 3-in-1 or toilet ADL Goal: Toileting - Hygiene - Progress: Goal set today Pt Will Perform Tub/Shower Transfer: Shower transfer;with modified independence;Ambulation;Shower seat with back ADL Goal: Tub/Shower Transfer - Progress: Goal set today  Visit Information  Last OT Received On: 11/19/11 Assistance Needed: +1 PT/OT Co-Evaluation/Treatment: Yes    Subjective Data      Prior Functioning  Home Living Lives With: Alone Available Help at Discharge: Family;Available PRN/intermittently;Available 24 hours/day (nephew 24/7 initially) Type of Home: Mobile  home Home Access: Ramped entrance Home Layout: One level Bathroom Shower/Tub: Tub/shower unit;Walk-in shower Bathroom Toilet: Standard Bathroom Accessibility: Yes How Accessible: Accessible via walker Home Adaptive Equipment: Shower  chair without back;Bedside commode/3-in-1 Prior Function Level of Independence: Independent Able to Take Stairs?: Yes Driving: Yes Vocation: Retired Musician: No difficulties Dominant Hand: Right    Cognition  Overall Cognitive Status: Appears within functional limits for tasks assessed/performed Behavior During Session: [facility] for tasks performed    Extremity/Trunk Assessment Right Upper Extremity Assessment RUE ROM/Strength/Tone: Within functional levels RUE Sensation: WFL - Light Touch;WFL - Proprioception RUE Coordination: WFL - gross/fine motor Left Upper Extremity Assessment LUE ROM/Strength/Tone: Within functional levels LUE Sensation: WFL - Light Touch;WFL - Proprioception LUE Coordination: WFL - gross/fine motor   Mobility Bed Mobility Bed Mobility: Not assessed (Pt up in chair upon OT arrival) Transfers Transfers: Sit to Stand;Stand to Sit Sit to Stand: 5: Supervision;From chair/3-in-1;With armrests;With upper extremity assist Stand to Sit: 5: Supervision;With armrests;With upper extremity assist;To chair/3-in-1 Details for Transfer Assistance: Cueing for safe hand placement   Exercise    Balance    End of Session OT - End of Session Equipment Utilized During Treatment: Gait belt Activity Tolerance: Patient tolerated treatment well Patient left: with call bell/phone within reach;Other (comment) (in BR on commode) Nurse Communication: Mobility status;Other (comment) (pt in BR)  11/19/2011 Cipriano Mile OTR/L Pager [phone] Office [phone]  Cipriano Mile 11/19/2011, 1:10 PM

## 2011-11-19 NOTE — Evaluation (Signed)
Speech Language Pathology Evaluation Patient Details Name: Johnathan Reynolds MRN: [id number] DOB: Mar 19, 1941 Today's Date: 11/19/2011  Past Medical History:  Past Medical History  Diagnosis Date  . Hypertension   . Nerve pain    Past Surgical History:  Past Surgical History  Procedure Date  . Craniotomy 11/15/2011    Procedure: CRANIOTOMY HEMATOMA EVACUATION SUBDURAL;  Surgeon: Clydene Fake, MD;  Location: [facility];  Service: Neurosurgery;  Laterality: N/A;  Bilateral Burr Hole Drainage for subdural hematoma    SLP Assessment/Plan/Recommendation Clinical Impression Statement: 71 y.o. 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  Patient reports a fall in February and MD suspects slow bleed since that fall.  Patient demonstrates functional cognitive-linguistic abilities without any impairments or need for cueing.  Patient will likely be safe and functional within his home with help for physical issus (per PT/OT).  No further skilled SLP services acutely or upon D/C.  Thank you for the lovely consult.  SLP Recommendation/Assessment: Patient does not need any further Speech Lanaguage Pathology Services No Skilled Speech Therapy: All education completed;Patient at baseline level of functioning SLP Recommendations Follow up Recommendations: None Equipment Recommended: None recommended by OT;None recommended by SLP Individuals Consulted Consulted and Agree with Results and Recommendations: Patient  SLP Evaluation Prior Functioning Cognitive/Linguistic Baseline: Within functional limits Type of Home: Mobile home Lives With: Alone Available Help at Discharge: Family;Available PRN/intermittently;Available 24 hours/day (nephew 24/7 initially) Education: Not stated Vocation: Retired  IT consultant Overall Cognitive Status: Appears within functional limits for tasks  assessed Orientation Level: Oriented X4 Attention: Alternating Memory: Appears intact Awareness: Appears intact Problem Solving: Appears intact Executive Function: Reasoning;Sequencing;Organizing;Decision Making;Initiating;Self Monitoring;Self Correcting Reasoning: Appears intact Sequencing: Appears intact Organizing: Appears intact Decision Making: Appears intact Initiating: Appears intact Self Monitoring: Appears intact Self Correcting: Appears intact Safety/Judgment: Appears intact  Comprehension Auditory Comprehension Overall Auditory Comprehension: Appears within functional limits for tasks assessed Visual Recognition/Discrimination Discrimination: Within Function Limits Reading Comprehension Reading Status: Within funtional limits  Expression Expression Primary Mode of Expression: Verbal Verbal Expression Overall Verbal Expression: Appears within functional limits for tasks assessed Initiation: No impairment Repetition: No impairment Naming: No impairment Pragmatics: No impairment Written Expression Dominant Hand: Right Written Expression: Within Functional Limits  Oral/Motor Oral Motor/Sensory Function Overall Oral Motor/Sensory Function: Appears within functional limits for tasks assessed Motor Speech Overall Motor Speech: Appears within functional limits for tasks assessed  Myra Rude, M.S.,CCC-SLP Pager [phone] 11/19/2011, 3:04 PM

## 2011-11-20 NOTE — Progress Notes (Signed)
Paged Dr. Phoebe Perch to let him know that pt's IV infiltrated and had to be removed.  Pt was only receiving KVO fluids and is possibly going to be d/c'd home today.  Awaiting return phone call to see if IV can be left out.

## 2011-11-20 NOTE — Progress Notes (Signed)
Physical Therapy Treatment Patient Details Name: VIDUR KNUST MRN: [id number] DOB: [birth year] Today's Date: 11/20/2011 Time: 3244-0102 PT Time Calculation (min): 20 min  PT Assessment / Plan / Recommendation Comments on Treatment Session  Pt progressing well with goals and overall mobility. Patient states that he is still anxious and apprehensive about falling. Performed DGI balance test and patient scored 16/24 putting him at high risk for increased falls. Patient stated that he would have more help at home if he waited and discharged tomorrow verses today.     Follow Up Recommendations  Supervision/Assistance - 24 hour    Equipment Recommendations  None recommended by OT;None recommended by PT;None recommended by SLP    Frequency Min 4X/week   Plan Discharge plan remains appropriate    Precautions / Restrictions Precautions Precautions: Fall   Pertinent Vitals/Pain     Mobility  Bed Mobility Bed Mobility: Supine to Sit Supine to Sit: 7: Independent Transfers Sit to Stand: 6: Modified independent (Device/Increase time) Stand to Sit: 6: Modified independent (Device/Increase time) Ambulation/Gait Ambulation/Gait Assistance: 5: Supervision Ambulation Distance (Feet): 500 Feet Assistive device: None Ambulation/Gait Assistance Details: Pt started ambulating with small slow steps but as ambulation increased patient became more confident and starting ambulating with increase stride length and increased gait speed.  Gait Pattern: Within Functional Limits Gait velocity: decreased but increasing slowly    Exercises     PT Goals Acute Rehab PT Goals PT Goal: Supine/Side to Sit - Progress: Progressing toward goal PT Goal: Sit to Stand - Progress: Progressing toward goal PT Goal: Stand to Sit - Progress: Progressing toward goal PT Goal: Ambulate - Progress: Progressing toward goal  Visit Information  Last PT Received On: 11/20/11 Assistance Needed: +1    Subjective Data  Subjective: Im still a little anxious   Cognition  Overall Cognitive Status: Appears within functional limits for tasks assessed/performed Arousal/Alertness: Awake/alert Orientation Level: Appears intact for tasks assessed Behavior During Session: [facility] for tasks performed    Balance  Standardized Balance Assessment Standardized Balance Assessment: Dynamic Gait Index Dynamic Gait Index Level Surface: Mild Impairment Change in Gait Speed: Mild Impairment Gait with Horizontal Head Turns: Mild Impairment Gait with Vertical Head Turns: Mild Impairment Gait and Pivot Turn: Mild Impairment Step Over Obstacle: Mild Impairment Step Around Obstacles: Mild Impairment Steps: Mild Impairment Total Score: 16   End of Session PT - End of Session Equipment Utilized During Treatment: Gait belt Activity Tolerance: Patient tolerated treatment well Patient left: in chair    Fredrich Birks 11/20/2011, 11:53 AM 11/20/2011 Fredrich Birks PTA [phone] pager [phone] office

## 2011-11-20 NOTE — Progress Notes (Signed)
Occupational Therapy Treatment Patient Details Name: Johnathan Reynolds MRN: [id number] DOB: [birth year] Today's Date: 11/20/2011 Time: 8295-6213 OT Time Calculation (min): 13 min  OT Assessment / Plan / Recommendation Comments on Treatment Session Pt progressing towards goals. Anticipate d/c tomorrow.    Follow Up Recommendations  Supervision/Assistance - 24 hour    Equipment Recommendations  None recommended by OT;None recommended by PT;None recommended by SLP    Frequency Min 2X/week   Plan Discharge plan remains appropriate    Precautions / Restrictions Precautions Precautions: Fall   Pertinent Vitals/Pain NA    ADL  Grooming: Simulated;Modified independent Where Assessed - Grooming: Standing at sink Toilet Transfer: Buyer, retail Method: Proofreader: Other (comment) (bed at toilet height) Ambulation Related to ADLs: Pt requiring increased time for ambulation with supervision. ADL Comments: Discussed with pt several energy conservation strategies to use upon d/c home.  Pt reports he showered earlier today and fatigued during session due to shower activity.    OT Goals ADL Goals Pt Will Perform Grooming: with modified independence;Standing at sink ADL Goal: Grooming - Progress: Met Pt Will Transfer to Toilet: with modified independence;Ambulation;Regular height toilet ADL Goal: Toilet Transfer - Progress: Progressing toward goals  Visit Information  Last OT Received On: 11/20/11 Assistance Needed: +1    Subjective Data      Prior Functioning       Cognition  Overall Cognitive Status: Appears within functional limits for tasks assessed/performed Arousal/Alertness: Awake/alert Orientation Level: Appears intact for tasks assessed Behavior During Session: [facility] for tasks performed    Mobility Bed Mobility Bed Mobility: Supine to Sit;Sitting - Scoot to Edge of Bed;Sit to Supine Supine to Sit: 7:  Independent Sitting - Scoot to Edge of Bed: 7: Independent Sit to Supine: 7: Independent Transfers Sit to Stand: 6: Modified independent (Device/Increase time);From bed;With upper extremity assist Stand to Sit: 6: Modified independent (Device/Increase time);To bed;With upper extremity assist   Exercises    Balance    End of Session OT - End of Session Activity Tolerance: Patient limited by fatigue Patient left: in bed;with call bell/phone within reach  11/20/2011 Johnathan Reynolds OTR/L Pager [phone] Office [phone]  Johnathan Reynolds 11/20/2011, 4:23 PM

## 2011-11-20 NOTE — Progress Notes (Signed)
Dr. Phoebe Perch to call back after he is out of case.

## 2011-11-20 NOTE — Progress Notes (Signed)
Doing well. C/o appropriate incisional soreness.  Amb/ voiding well  Temp:  [97.4 F (36.3 C)-98 F (36.7 C)] 97.8 F (36.6 C) (04/25 0956) Pulse Rate:  [74-101] 101  (04/25 0956) Resp:  [15-20] 20  (04/25 0956) BP: (99-127)/(63-78) 99/63 mmHg (04/25 0956) SpO2:  [92 %-99 %] 97 % (04/25 0956) Weight:  [72.8 kg (160 lb 7.9 oz)] 72.8 kg (160 lb 7.9 oz) (04/24 2020) Good strength and sensation Incision CDI  Plan: Continue work with PT today - plan d/c in am

## 2011-11-21 MED ORDER — LEVETIRACETAM 500 MG PO TABS: 500.0000 mg | ORAL_TABLET | Freq: Two times a day (BID) | ORAL | Status: DC

## 2011-11-21 NOTE — Discharge Summary (Signed)
Physician Discharge Summary  Patient ID: Johnathan Reynolds MRN: [id number] DOB/AGE: 03-16-1941 71 y.o.  Admit date: 11/14/2011 Discharge date: 11/21/2011  Admission Diagnoses:subdural hematoma, bilateral   Discharge Diagnoses: subdural hematoma, bilateral  Active Problems:  * No active hospital problems. *    Discharged Condition: good  Hospital Course: pt admitted with confusion, weakness - found to have bilat SDH - went to surgery for burr hole drainage of SDH. Pt had drain for 3 days - slowi=ly increased activity after that - worked with STx and PT, had significant imrovement and will be d/c home  Consults: None  Significant Diagnostic Studies: none  Treatments: surgery: Bilateral burr hole evacuation SUBDURAL hematoma   Discharge Exam: Blood pressure 112/74, pulse 86, temperature 97.8 F (36.6 C), temperature source Oral, resp. rate 18, height 5\' 10"  (1.778 m), weight 72.8 kg (160 lb 7.9 oz), SpO2 98.00%. Wound:c/d/i  Disposition: home   Medication List  As of 11/21/2011  7:52 AM   TAKE these medications         gabapentin 300 MG capsule   Commonly known as: NEURONTIN   Take 300 mg by mouth daily.      levETIRAcetam 500 MG tablet   Commonly known as: KEPPRA   Take 1 tablet (500 mg total) by mouth 2 (two) times daily.      lisinopril 5 MG tablet   Commonly known as: PRINIVIL,ZESTRIL   Take 5 mg by mouth daily.      mulitivitamin with minerals Tabs   Take 1 tablet by mouth daily.      zolpidem 10 MG tablet   Commonly known as: AMBIEN   Take 10 mg by mouth at bedtime as needed. For insomnia             Signed: Clydene Fake, MD 11/21/2011, 7:52 AM

## 2011-11-21 NOTE — Progress Notes (Signed)
Occupational Therapy Treatment Patient Details Name: Johnathan Reynolds MRN: [id number] DOB: 05/30/1941 Today's Date: 11/21/2011 Time: 4098-1191 OT Time Calculation (min): 12 min  OT Assessment / Plan / Recommendation Comments on Treatment Session Pt planning to go home today.  Pt has met all goals and education completed. Pt will have 24/7 assist from family at home.    Follow Up Recommendations  Supervision/Assistance - 24 hour    Equipment Recommendations  None recommended by OT    Frequency Min 2X/week   Plan All goals met and education completed, patient discharged from OT services;Discharge plan remains appropriate    Precautions / Restrictions Precautions Precautions: Fall   Pertinent Vitals/Pain NA    ADL  Grooming: Performed;Wash/dry hands;Modified independent Where Assessed - Grooming: Standing at sink Upper Body Bathing: Performed;Modified independent Where Assessed - Upper Body Bathing: Sitting, chair Lower Body Bathing: Simulated;Modified independent Where Assessed - Lower Body Bathing: Sit to stand from chair Upper Body Dressing: Simulated;Modified independent Where Assessed - Upper Body Dressing: Sitting, chair Lower Body Dressing: Simulated;Modified independent Where Assessed - Lower Body Dressing: Sit to stand from chair Toilet Transfer: Simulated;Modified independent Toilet Transfer Method: Proofreader: Other (comment) (chair) Toileting - Clothing Manipulation: Modified independent;Simulated Where Assessed - Toileting Clothing Manipulation: Standing Toileting - Hygiene: Simulated;Independent Where Assessed - Toileting Hygiene: Standing Tub/Shower Transfer: Simulated;Supervision/safety (simulated step over into walk in shower) Tub/Shower Transfer Method: Ambulating Ambulation Related to ADLs: Pt able to ambulate within room with mod I.  Pt requested walk in hall, recommend supervision for longer distance.  ADL Comments: Pt able to  gather ADL items with mod I for increased time.    OT Goals ADL Goals Pt Will Perform Grooming: with modified independence;Standing at sink ADL Goal: Grooming - Progress: Met Pt Will Perform Upper Body Bathing: with modified independence;Sitting, edge of bed;Sitting, chair ADL Goal: Upper Body Bathing - Progress: Met Pt Will Perform Lower Body Bathing: with modified independence;Sit to stand from chair;Sit to stand from bed ADL Goal: Lower Body Bathing - Progress: Met Pt Will Perform Upper Body Dressing: with modified independence;Sitting, chair;Sitting, bed ADL Goal: Upper Body Dressing - Progress: Met Pt Will Perform Lower Body Dressing: with modified independence;Sit to stand from chair;Sit to stand from bed ADL Goal: Lower Body Dressing - Progress: Met Pt Will Transfer to Toilet: with modified independence;Ambulation;Regular height toilet ADL Goal: Toilet Transfer - Progress: Met Pt Will Perform Toileting - Clothing Manipulation: with modified independence;Standing ADL Goal: Toileting - Clothing Manipulation - Progress: Met Pt Will Perform Toileting - Hygiene: with modified independence;Sitting on 3-in-1 or toilet ADL Goal: Toileting - Hygiene - Progress: Met Pt Will Perform Tub/Shower Transfer: Shower transfer;with modified independence;Ambulation;Shower seat with back ADL Goal: Web designer - Progress: Met  Visit Information  Last OT Received On: 11/21/11 Assistance Needed: +1    Subjective Data      Prior Functioning       Cognition  Overall Cognitive Status: Appears within functional limits for tasks assessed/performed Arousal/Alertness: Awake/alert Orientation Level: Appears intact for tasks assessed Behavior During Session: [facility] for tasks performed    Mobility Transfers Sit to Stand: 6: Modified independent (Device/Increase time);From chair/3-in-1;With armrests;With upper extremity assist Stand to Sit: 6: Modified independent (Device/Increase time);To  chair/3-in-1;With armrests;With upper extremity assist   Exercises    Balance    End of Session OT - End of Session Activity Tolerance: Patient tolerated treatment well Patient left: in chair;with call bell/phone within reach  11/21/2011 Cipriano Mile OTR/L Pager [phone]  Office [phone]   Cipriano Mile 11/21/2011, 9:27 AM

## 2011-11-21 NOTE — Progress Notes (Signed)
Agree with student PT treatment note. Tattianna Schnarr, PT DPT [phone]  

## 2011-11-21 NOTE — Progress Notes (Signed)
Pt was provided with d/c instructions. Pt given prescription for new medication and informed on how to take it. Pt has no questions at this time. Will call dr to make an appointment for follow up. Pt is waiting for ride. Will continue to monitor until patient leaves. Millissa Deese burris, Rn

## 2011-11-21 NOTE — Progress Notes (Signed)
Physical Therapy Treatment Patient Details Name: Johnathan Reynolds MRN: [id number] DOB: 1941/03/21 Today's Date: 11/21/2011 Time: 1025-1040 PT Time Calculation (min): 15 min  PT Assessment / Plan / Recommendation Comments on Treatment Session  Pt progressing well with all ambulation and mobility actitivies.  Discussed with the patient that he should have 24 hour supervision for safety initially upon disharge.    Follow Up Recommendations  Supervision/Assistance - 24 hour    Equipment Recommendations  None recommended by PT    Frequency Min 4X/week   Plan Discharge plan remains appropriate    Precautions / Restrictions Precautions Precautions: Fall Restrictions Weight Bearing Restrictions: No   Pertinent Vitals/Pain     Mobility  Transfers Transfers: Sit to Stand;Stand to Sit Sit to Stand: 6: Modified independent (Device/Increase time);From chair/3-in-1;With armrests;With upper extremity assist Stand to Sit: 6: Modified independent (Device/Increase time);To chair/3-in-1;With armrests;With upper extremity assist Ambulation/Gait Ambulation/Gait Assistance: 5: Supervision Ambulation Distance (Feet): 500 Feet Assistive device: None Ambulation/Gait Assistance Details: Pt required superivision for safety. Gait Pattern: Step-through pattern;Decreased stride length Gait velocity: decreased but increasing slowly Stairs: No    Exercises     PT Goals Acute Rehab PT Goals PT Goal Formulation: With patient Time For Goal Achievement: 11/26/11 Potential to Achieve Goals: Good Pt will go Sit to Stand: Independently PT Goal: Sit to Stand - Progress: Progressing toward goal Pt will go Stand to Sit: Independently PT Goal: Stand to Sit - Progress: Progressing toward goal Pt will Ambulate: >150 feet;with modified independence PT Goal: Ambulate - Progress: Progressing toward goal  Visit Information  Last PT Received On: 11/21/11 Assistance Needed: +1    Subjective Data  Subjective:  Feel more steady on my feet today. Patient Stated Goal: To go home.   Cognition  Overall Cognitive Status: Appears within functional limits for tasks assessed/performed Arousal/Alertness: Awake/alert Orientation Level: Appears intact for tasks assessed Behavior During Session: [facility] for tasks performed    Balance  Balance Balance Assessed: No  End of Session PT - End of Session Equipment Utilized During Treatment: Gait belt Activity Tolerance: Patient tolerated treatment well Patient left: in chair;with call bell/phone within reach    Cobb, Roselee Nova SPT 11/21/2011, 12:12 PM

## 2011-11-24 ENCOUNTER — Other Ambulatory Visit: Payer: Self-pay | Admitting: Neurosurgery

## 2011-11-24 DIAGNOSIS — S065X9A Traumatic subdural hemorrhage with loss of consciousness of unspecified duration, initial encounter: Secondary | ICD-10-CM

## 2011-11-24 DIAGNOSIS — S065XAA Traumatic subdural hemorrhage with loss of consciousness status unknown, initial encounter: Secondary | ICD-10-CM

## 2011-12-16 ENCOUNTER — Ambulatory Visit
Admission: RE | Admit: 2011-12-16 | Discharge: 2011-12-16 | Disposition: A | Discharge status: Home / Self Care | Payer: Medicare Other | Source: Ambulatory Visit | Attending: Neurosurgery | Admitting: Neurosurgery

## 2011-12-16 DIAGNOSIS — S065X9A Traumatic subdural hemorrhage with loss of consciousness of unspecified duration, initial encounter: Secondary | ICD-10-CM

## 2011-12-16 DIAGNOSIS — S065XAA Traumatic subdural hemorrhage with loss of consciousness status unknown, initial encounter: Secondary | ICD-10-CM

## 2011-12-31 ENCOUNTER — Other Ambulatory Visit: Payer: Self-pay | Admitting: Neurosurgery

## 2011-12-31 DIAGNOSIS — S065X9A Traumatic subdural hemorrhage with loss of consciousness of unspecified duration, initial encounter: Secondary | ICD-10-CM

## 2011-12-31 DIAGNOSIS — S065XAA Traumatic subdural hemorrhage with loss of consciousness status unknown, initial encounter: Secondary | ICD-10-CM

## 2012-01-20 ENCOUNTER — Ambulatory Visit
Admission: RE | Admit: 2012-01-20 | Discharge: 2012-01-20 | Disposition: A | Discharge status: Home / Self Care | Payer: Medicare Other | Source: Ambulatory Visit | Attending: Neurosurgery | Admitting: Neurosurgery

## 2012-01-20 DIAGNOSIS — S065X9A Traumatic subdural hemorrhage with loss of consciousness of unspecified duration, initial encounter: Secondary | ICD-10-CM

## 2012-01-20 DIAGNOSIS — S065XAA Traumatic subdural hemorrhage with loss of consciousness status unknown, initial encounter: Secondary | ICD-10-CM

## 2012-02-10 ENCOUNTER — Other Ambulatory Visit: Payer: Self-pay | Admitting: Neurosurgery

## 2012-02-10 DIAGNOSIS — S065X9A Traumatic subdural hemorrhage with loss of consciousness of unspecified duration, initial encounter: Secondary | ICD-10-CM

## 2012-02-10 DIAGNOSIS — S065XAA Traumatic subdural hemorrhage with loss of consciousness status unknown, initial encounter: Secondary | ICD-10-CM

## 2012-02-25 ENCOUNTER — Ambulatory Visit
Admission: RE | Admit: 2012-02-25 | Discharge: 2012-02-25 | Disposition: A | Discharge status: Home / Self Care | Payer: Medicare Other | Source: Ambulatory Visit | Attending: Neurosurgery | Admitting: Neurosurgery

## 2012-02-25 DIAGNOSIS — S065XAA Traumatic subdural hemorrhage with loss of consciousness status unknown, initial encounter: Secondary | ICD-10-CM

## 2012-02-25 DIAGNOSIS — S065X9A Traumatic subdural hemorrhage with loss of consciousness of unspecified duration, initial encounter: Secondary | ICD-10-CM

## 2012-05-10 ENCOUNTER — Other Ambulatory Visit: Payer: Self-pay | Admitting: Neurosurgery

## 2012-05-10 DIAGNOSIS — S065X9A Traumatic subdural hemorrhage with loss of consciousness of unspecified duration, initial encounter: Secondary | ICD-10-CM

## 2012-05-10 DIAGNOSIS — S065XAA Traumatic subdural hemorrhage with loss of consciousness status unknown, initial encounter: Secondary | ICD-10-CM

## 2012-05-17 ENCOUNTER — Ambulatory Visit
Admission: RE | Admit: 2012-05-17 | Discharge: 2012-05-17 | Disposition: A | Discharge status: Home / Self Care | Payer: Medicare Other | Source: Ambulatory Visit | Attending: Neurosurgery | Admitting: Neurosurgery

## 2012-05-17 DIAGNOSIS — S065X9A Traumatic subdural hemorrhage with loss of consciousness of unspecified duration, initial encounter: Secondary | ICD-10-CM

## 2012-05-17 DIAGNOSIS — S065XAA Traumatic subdural hemorrhage with loss of consciousness status unknown, initial encounter: Secondary | ICD-10-CM

## 2014-01-16 IMAGING — CR DG CHEST 2V
2 series · 2 of 2 positions shown · non-contrast
Comparison: None.

CLINICAL DATA: Preop

CHEST - 2 VIEW

[w chest pa]
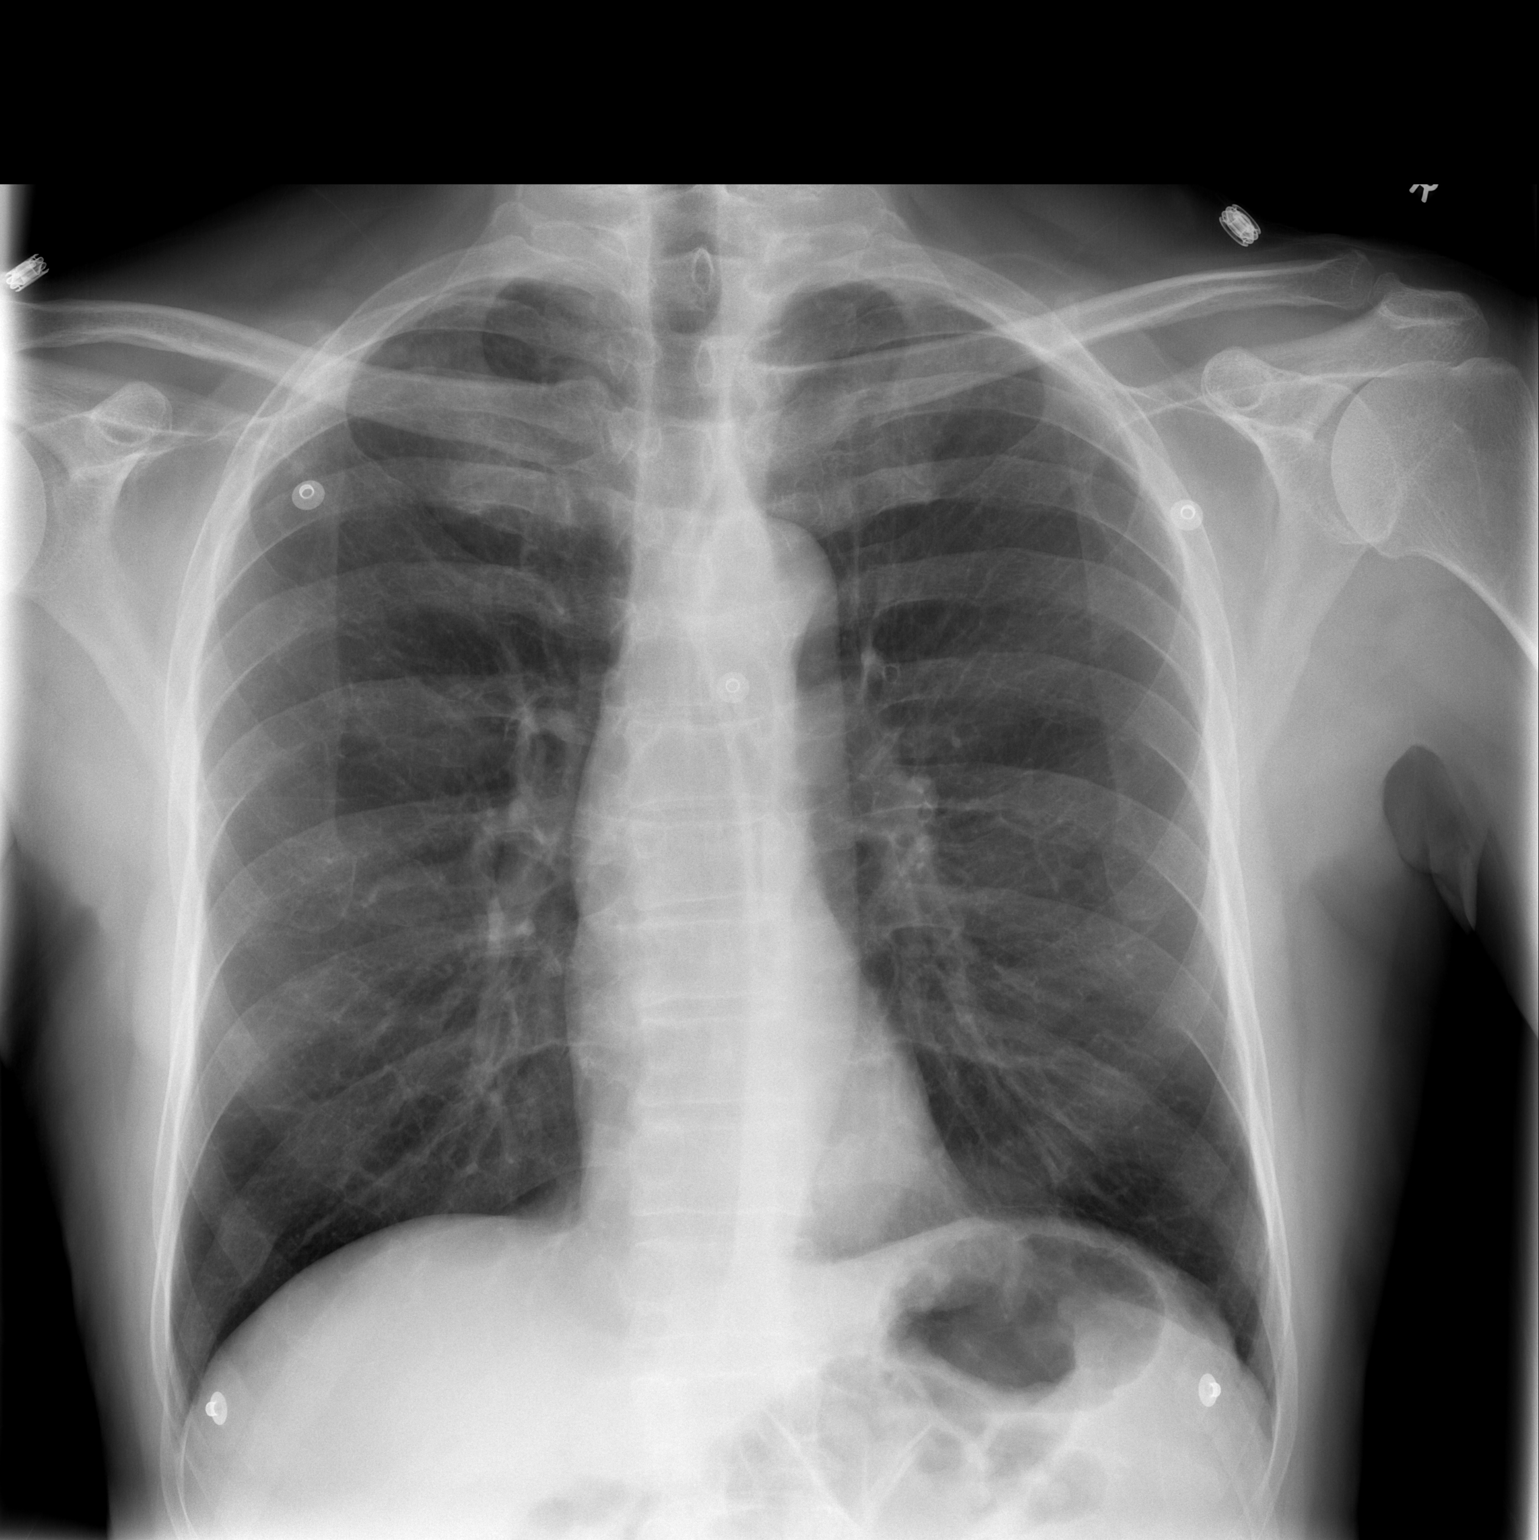

[w chest lat]
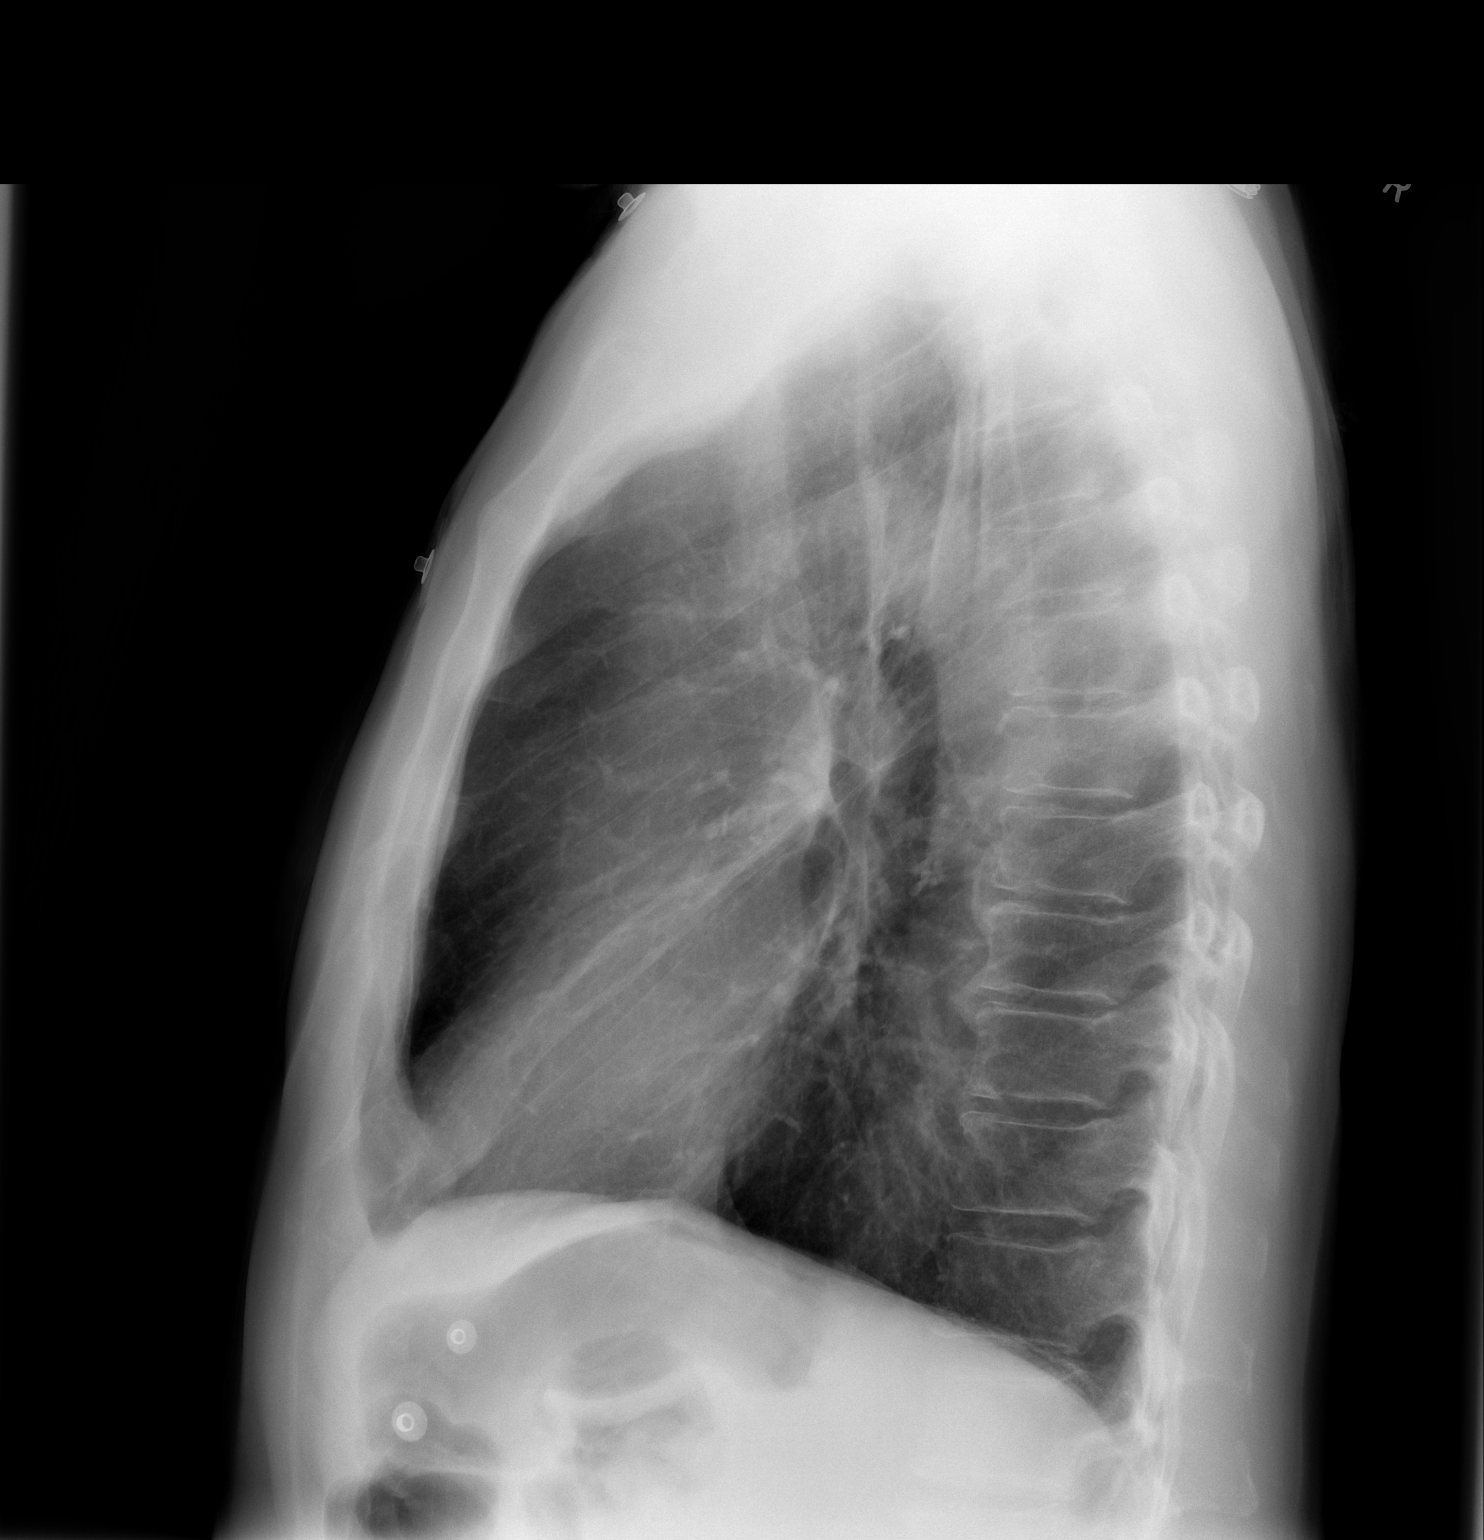

[2 of 2 positions shown; findings below may reference images not displayed]

FINDINGS: Lungs are clear. No pleural effusion or pneumothorax.

Cardiomediastinal silhouette is within normal limits.

Degenerative changes of the visualized thoracolumbar spine.
IMPRESSION: No evidence of acute cardiopulmonary disease.

## 2014-02-17 IMAGING — CT CT HEAD W/O CM
2 series · 16 of 30 positions shown, 18 images · non-contrast
Comparison: CT 11/19/2011

CLINICAL DATA: Head injury [DATE].  Follow-up subdural hemorrhage.

CT HEAD WITHOUT CONTRAST
TECHNIQUE: Contiguous axial images were obtained from the base of
the skull through the vertex without contrast.

[Series 3: head bone · axial · 0.49mm/px · z∈[+29,+139]mm · 8 of 56 slices shown]
[im 6/56  bone]
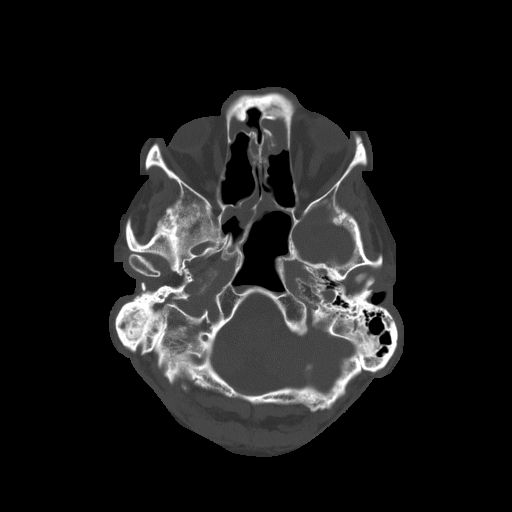
[im 12/56  bone]
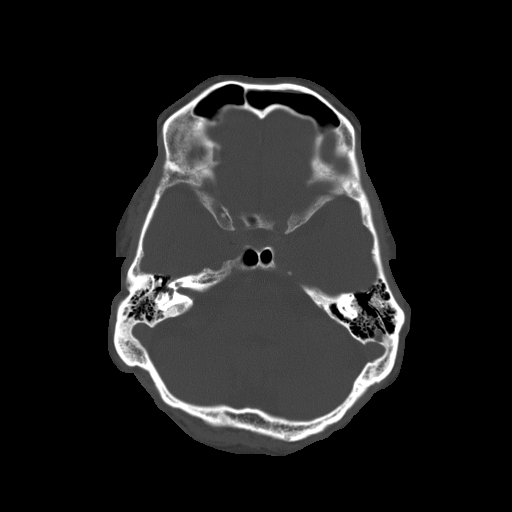
[im 18/56  bone]
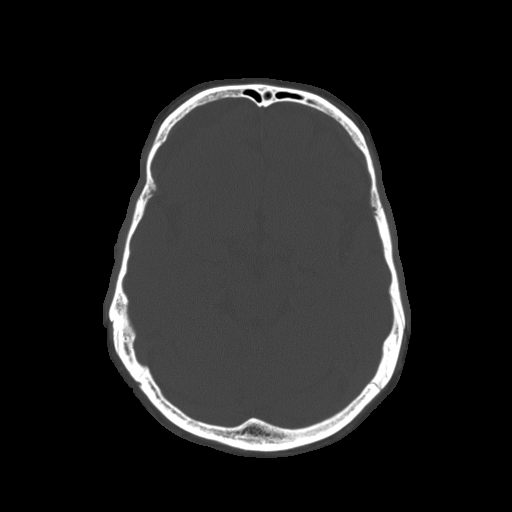
[im 24/56  bone]
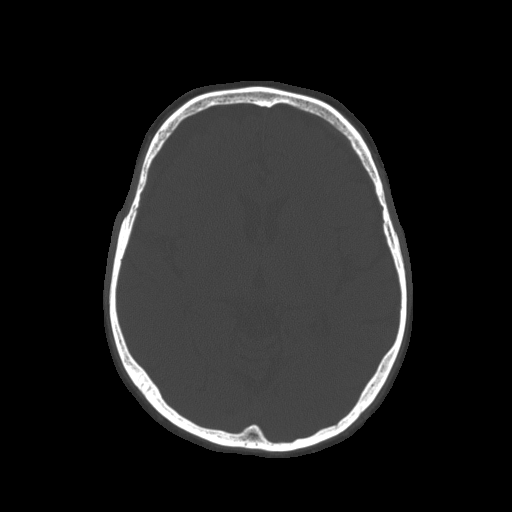
[im 32/56  bone]
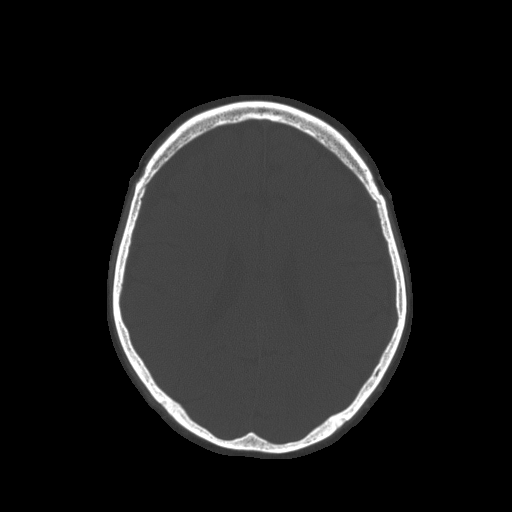
[im 38/56  bone]
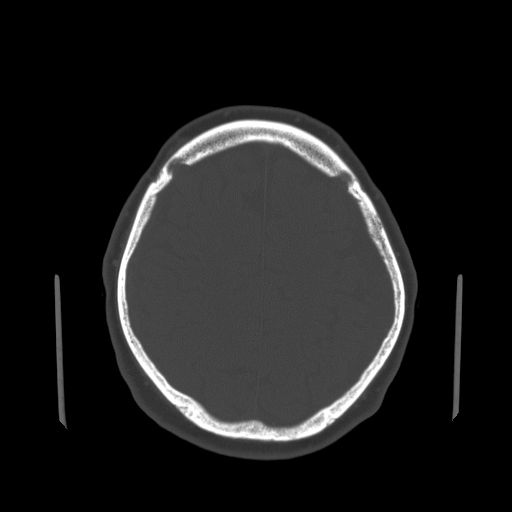
[im 44/56  bone]
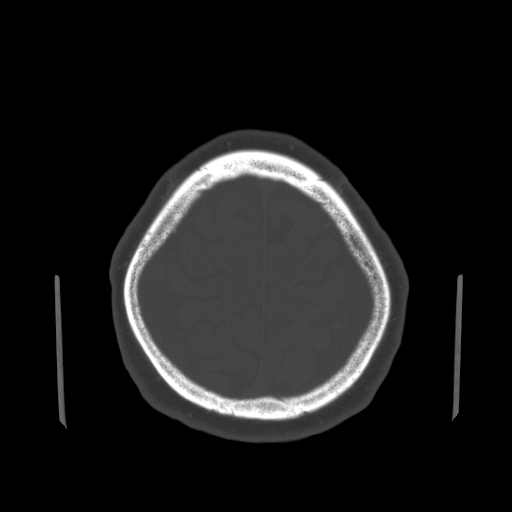
[im 50/56  bone]
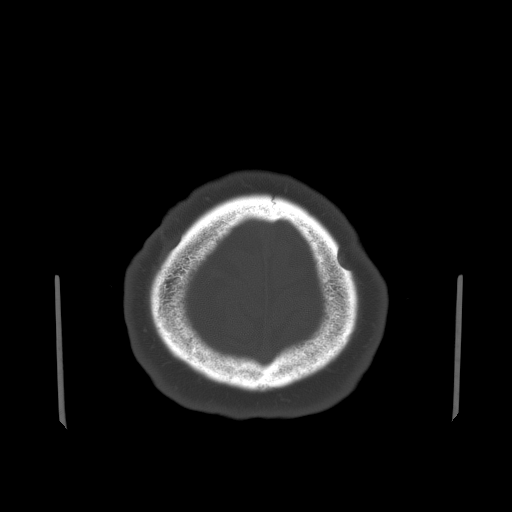

[Series 32: 3d filtered head w/o · axial · non-contrast · 0.49mm/px · z∈[+33,+138]mm · 8 of 28 slices shown, 10 images]
[im 4/28  brain]
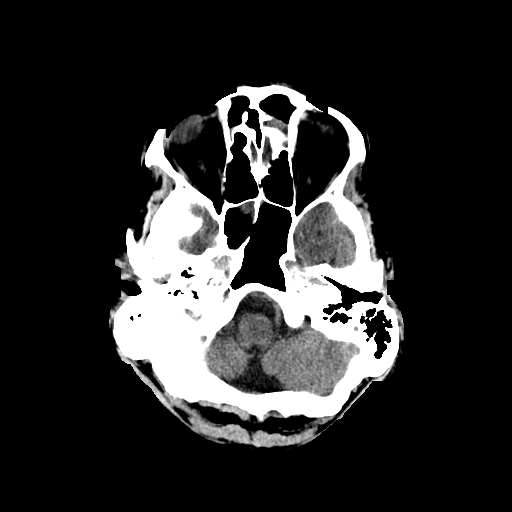
[im 4/28  bone]
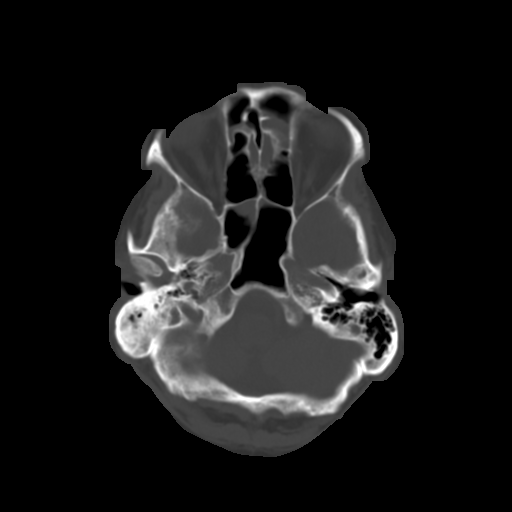
[im 7/28  brain]
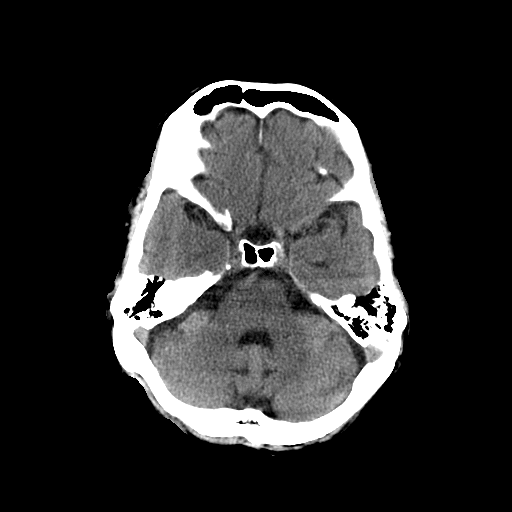
[im 10/28  brain]
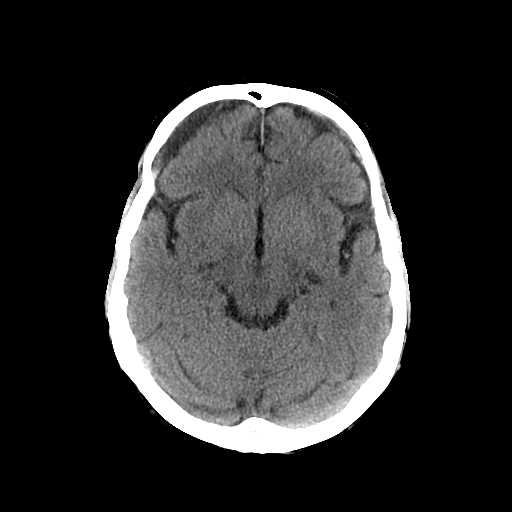
[im 13/28  brain]
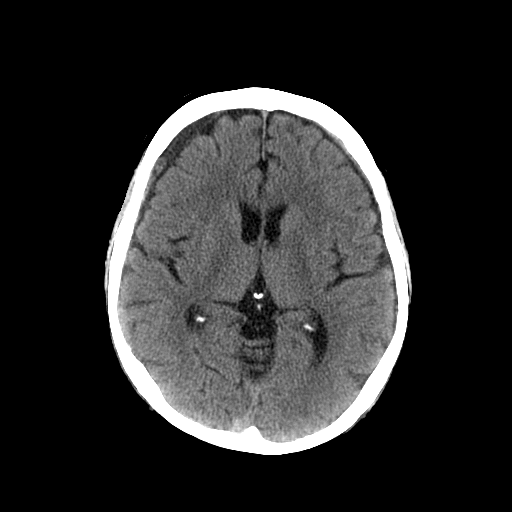
[im 16/28  brain]
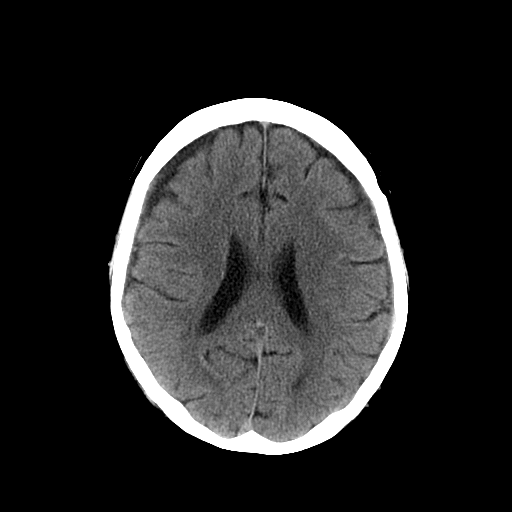
[im 16/28  bone]
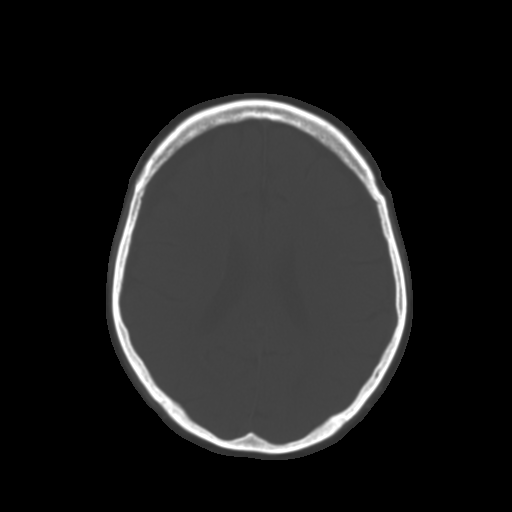
[im 19/28  brain]
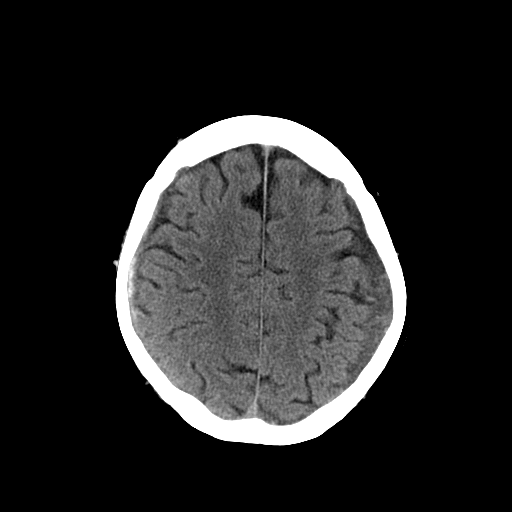
[im 22/28  brain]
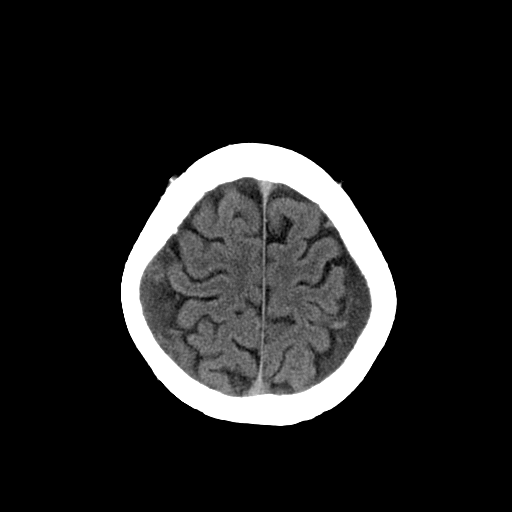
[im 25/28  brain]
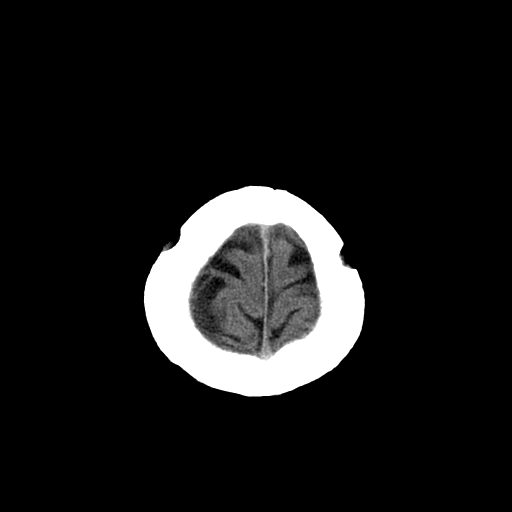

[16 of 30 positions shown; findings below may reference images not displayed]

FINDINGS: Bifrontal burr hole drainage of subdural hematomas.
Small mixed density subdural hematomas are present bilaterally and
appear slightly smaller.  Subdural collections containing
intermediate and high density blood.  There appears to have  been
some subdural bleeding since the prior study.

Ventricle size is normal.  No midline shift.  No acute infarct mass
or edema in the brain.

Prior sinus surgery.  Mucosal edema in the paranasal sinuses as
well as an air-fluid level in the sphenoid sinus.
IMPRESSION: Small mixed density subdural hematomas bilaterally are smaller
compared with the prior CT..  There appears to have  been some mild
bleeding into the subdural space bilaterally.  No midline shift.

## 2014-11-13 DIAGNOSIS — I639 Cerebral infarction, unspecified: Secondary | ICD-10-CM

## 2014-11-13 HISTORY — DX: Cerebral infarction, unspecified: I63.9

## 2014-12-11 ENCOUNTER — Other Ambulatory Visit: Payer: Self-pay

## 2014-12-11 ENCOUNTER — Encounter: Payer: Self-pay | Admitting: Internal Medicine

## 2014-12-11 ENCOUNTER — Ambulatory Visit (INDEPENDENT_AMBULATORY_CARE_PROVIDER_SITE_OTHER): Payer: Medicare HMO | Admitting: Internal Medicine

## 2014-12-11 VITALS — BP 138/78 | HR 69 | Ht 70.5 in | Wt 172.8 lb

## 2014-12-11 DIAGNOSIS — R208 Other disturbances of skin sensation: Secondary | ICD-10-CM | POA: Diagnosis not present

## 2014-12-11 DIAGNOSIS — I10 Essential (primary) hypertension: Secondary | ICD-10-CM | POA: Diagnosis not present

## 2014-12-11 DIAGNOSIS — I639 Cerebral infarction, unspecified: Secondary | ICD-10-CM | POA: Diagnosis not present

## 2014-12-11 DIAGNOSIS — R209 Unspecified disturbances of skin sensation: Secondary | ICD-10-CM

## 2014-12-11 DIAGNOSIS — M79661 Pain in right lower leg: Secondary | ICD-10-CM | POA: Diagnosis not present

## 2014-12-11 NOTE — Patient Instructions (Addendum)
Medication Instructions:  Your physician recommends that you continue on your current medications as directed. Please refer to the Current Medication list given to you today.   Labwork: None ordered  Testing/Procedures: TEE and LINQ implant ob 12/21/14.  Please check in at [place] [facility] [place] after midnight   Your physician has requested that you have a lower or upper extremity arterial duplex. This test is an ultrasound of the arteries in the legs or arms. It looks at arterial blood flow in the legs and arms. Allow one hour for Lower and Upper Arterial scans. There are no restrictions or special instructions     Follow-Up: Your physician recommends that you schedule a follow-up appointment in: 7-10 days from 12/21/14 in the device clinic for wound check   Any Other Special Instructions Will Be Listed Below (If Applicable).

## 2014-12-11 NOTE — Progress Notes (Signed)
Electrophysiology Office Note   Date:  12/11/2014   ID:  Johnathan, Reynolds 1941/01/12, MRN [id number]  PCP:  Rory Percy, MD  Cardiologist:  none Primary Electrophysiologist: Thompson Grayer, MD    Chief Complaint  Patient presents with  . Cerebrovascular Accident     History of Present Illness: Johnathan Reynolds is a 74 y.o. male who presents today for electrophysiology evaluation.   He reports being in good health until 11/13/14 when he developed stroke while working in his garden. His symptoms started with R hand numbness.  This progressed to R sided weakness/ numbness and an inability to walk.  His symptoms gradually improved over several days.  He continues to have some residual weakness.  He was told that he was outside of the window for thrombolytics upon presentation.  He was placed on xarelto in the hospital though per my discussion with Dr Nadara Mustard, afib was not documented in the hospital.  The patient was told that his echo revealed an "irregular heart beat" but was otherwise normal.  Carotid dopplers were also normal.  He has been on ASA since leaving the hospital.  He has rare palpitations. He has had a prior SDH which required evacuation and is reluctant to take anticoagulation. He has had issues with R leg pain for about 6 weeks.  He was planned to have an MRI of his back when he had a stroke and delayed this workup.  Today, he denies symptoms of chest pain, shortness of breath, orthopnea, PND, lower extremity edema, claudication, dizziness, presyncope, syncope, bleeding, or neurologic sequela. The patient is tolerating medications without difficulties and is otherwise without complaint today.    Past Medical History  Diagnosis Date  . Hypertension   . Nerve pain   . DJD (degenerative joint disease)   . DDD (degenerative disc disease), cervical   . Stroke 09/08/22    embolic, evaluated at [facility]  . Subdural hematoma 10/2011    presumed spontaneous, though he had  had a fall 2 months prior with head injury   Past Surgical History  Procedure Laterality Date  . Craniotomy  11/15/2011    Procedure: CRANIOTOMY HEMATOMA EVACUATION SUBDURAL;  Surgeon: Otilio Connors, MD;  Location: [facility];  Service: Neurosurgery;  Laterality: N/A;  Bilateral [facility] Drainage for subdural hematoma  . Laminectomy      twice, most recently 1996     Current Outpatient Prescriptions  Medication Sig Dispense Refill  . acetaminophen (TYLENOL) 650 MG CR tablet Take 1,300 mg by mouth 2 (two) times daily.    . Calcium Carb-Cholecalciferol 600-800 MG-UNIT TABS Take 1 tablet by mouth daily.    Marland Kitchen gabapentin (NEURONTIN) 300 MG capsule Take 300 mg by mouth in the morning and take 600 mg by mouth in the evening    . lisinopril (PRINIVIL,ZESTRIL) 5 MG tablet Take 5 mg by mouth daily.    . Multiple Vitamin (MULITIVITAMIN WITH MINERALS) TABS Take 1 tablet by mouth daily.     No current facility-administered medications for this visit.    Allergies:   Review of patient's allergies indicates no known allergies.   Social History:  The patient  reports that he has never smoked. He does not have any smokeless tobacco history on file. He reports that he drinks alcohol. He reports that he does not use illicit drugs.   Family History:  The patient's  family history includes CAD in an other family member.    ROS:  Please  see the history of present illness.   All other systems are reviewed and negative.    PHYSICAL EXAM: VS:  BP 138/78 mmHg  Pulse 69  Ht 5' 10.5" (1.791 m)  Wt 172 lb 12.8 oz (78.382 kg)  BMI 24.44 kg/m2 , BMI Body mass index is 24.44 kg/(m^2). GEN: Well nourished, well developed, in no acute distress HEENT: normal Neck: no JVD, carotid bruits, or masses Cardiac: RRR; no murmurs, rubs, or gallops,no edema, R foot is very cool.  He has 2+ DP/PT pulses bilaterally and good cap refill to his foot Respiratory:  clear to auscultation bilaterally, normal work of  breathing GI: soft, nontender, nondistended, + BS MS: no deformity or atrophy Skin: warm and dry  Neuro:  Strength and sensation are intact Psych: euthymic mood, full affect  EKG:  EKG is ordered today. The ekg ordered today shows sinus rhythm 69 bpm, otherwise normal ekg  Wt Readings from Last 3 Encounters:  12/11/14 172 lb 12.8 oz (78.382 kg)  11/19/11 160 lb 7.9 oz (72.8 kg)      Other studies Reviewed: Additional studies/ records that were reviewed today include: 17 pages of records from [facility] are reviewed today as above, I have also spoken with Dr Nadara Mustard about this patient recently.   ASSESSMENT AND PLAN:  1. Cryptogenic stroke The patient has a recent cryptogenic stroke. I spoke at length with the patient about monitoring for afib with either a 30 day event monitor or an implantable loop recorder.  I would also advise TEE to evaluate for LA thrombus/ PFO. Risks, benefits, and alteratives to TEE and implantable loop recorder were discussed with the patient today.   At this time, the patient is very clear in their decision to proceed.  We will therefore schedule TEE with ILR implant by me on the same day in the next week.  2. HTN Stable No change required today   Current medicines are reviewed at length with the patient today.   The patient does not have concerns regarding his medicines.  The following changes were made today:  none  Signed, Thompson Grayer, MD  12/11/2014 3:21 PM     [place] [address] [phone] (office) [phone] (fax)

## 2014-12-12 ENCOUNTER — Other Ambulatory Visit: Payer: Self-pay | Admitting: *Deleted

## 2014-12-12 DIAGNOSIS — I639 Cerebral infarction, unspecified: Secondary | ICD-10-CM

## 2014-12-13 ENCOUNTER — Telehealth (HOSPITAL_COMMUNITY): Payer: Self-pay | Admitting: *Deleted

## 2014-12-13 NOTE — Telephone Encounter (Signed)
Spoke with pt and he states that he is scheduled to have a loop recorder placed on 5/26. Pt states that he spoke with his insurance and they said that the procedure would not be covered if not medically necessary. Pt fears it wont be covered due to his last EKG being normal. Pt wants to cancel appt if procedure is not going to covered. Informed pt that I would send this information over to Dr. Rayann Heman and his nurse Claiborne Billings for review, advisement and follow up. Pt verbalized understanding and was in agreement with this plan.

## 2014-12-13 NOTE — Telephone Encounter (Signed)
Pt called because he called [employer] regarding his procedure and [place] will not cover it because its not deemed medically necessary. The patient wants to the procedure cancelled. Please Call  [place]

## 2014-12-13 NOTE — Telephone Encounter (Signed)
Message routed to [facility] Triage - patient of Dr. Thompson Grayer

## 2014-12-14 NOTE — Telephone Encounter (Signed)
Spoke with patient and let him know Dr. Rayann Heman feels it is medically necessary however can not guarantee they will pay.  He will call me back and let me know if he wishes to cancel the [facility]

## 2014-12-19 ENCOUNTER — Telehealth: Payer: Self-pay | Admitting: Internal Medicine

## 2014-12-19 NOTE — Telephone Encounter (Signed)
New Message  Pt  Calling to speak w/ Rn about upcoming procedure. Please call back and discuss.

## 2014-12-19 NOTE — Telephone Encounter (Signed)
Pt requesting to cancel LINQ scheduled for 12/21/14.   I have cancelled TEE (spoke with Vickie) and LINQ (spoke with Chrystal)  scheduled for 12/21/14 at patient's request, I will forward to Kelly/Dr Allred.

## 2014-12-19 NOTE — Telephone Encounter (Signed)
Pt asking if LINQ scheduled for 2/57/50 has been pre certified. Per Charmine-- LINQ insertion is not pre authorized ,it follows monitor guidelines for insurance payment.   Pt advised that our office does not pre authorize LINQ per Charmaine.

## 2014-12-19 NOTE — Telephone Encounter (Signed)
Pt states he has been told by his insurance that [facility] requires pre authorization and it is the providers' responsibility to get pre authorization.   Pt advised that notes indicate Dr Rayann Heman feels LINQ medically necessary.

## 2014-12-19 NOTE — Telephone Encounter (Signed)
In our experience, implantable monitors have not required prior authorization.   I am happy to work with his insurance company however if he changes his mind. He should call our office if he wishes for me to assist further going forward.

## 2014-12-21 ENCOUNTER — Encounter (HOSPITAL_COMMUNITY): Admission: RE | Payer: Self-pay | Source: Ambulatory Visit

## 2014-12-21 ENCOUNTER — Ambulatory Visit (HOSPITAL_COMMUNITY): Admission: RE | Admit: 2014-12-21 | Payer: Medicare HMO | Source: Ambulatory Visit | Admitting: Internal Medicine

## 2014-12-21 SURGERY — LOOP RECORDER INSERTION

## 2014-12-21 SURGERY — ECHOCARDIOGRAM, TRANSESOPHAGEAL
Anesthesia: Moderate Sedation

## 2014-12-28 ENCOUNTER — Telehealth: Payer: Self-pay | Admitting: Internal Medicine

## 2014-12-28 NOTE — Telephone Encounter (Signed)
I have canceled his device clinic appointment but will have Melissa call to schedule him for his LEA dopplers.  He is aware

## 2014-12-28 NOTE — Telephone Encounter (Signed)
New message      Pt has an appt on Monday for a procedure follow up.  He did not have the procedure.  Should he still come?

## 2015-01-01 ENCOUNTER — Other Ambulatory Visit: Payer: Self-pay | Admitting: Internal Medicine

## 2015-01-01 ENCOUNTER — Ambulatory Visit (HOSPITAL_COMMUNITY): Payer: Medicare HMO | Attending: Internal Medicine

## 2015-01-01 DIAGNOSIS — M79661 Pain in right lower leg: Secondary | ICD-10-CM | POA: Diagnosis not present

## 2015-01-01 DIAGNOSIS — R209 Unspecified disturbances of skin sensation: Secondary | ICD-10-CM

## 2015-01-01 DIAGNOSIS — R208 Other disturbances of skin sensation: Secondary | ICD-10-CM | POA: Diagnosis not present

## 2015-05-10 DIAGNOSIS — Z23 Encounter for immunization: Secondary | ICD-10-CM | POA: Diagnosis not present

## 2015-05-22 DIAGNOSIS — H2510 Age-related nuclear cataract, unspecified eye: Secondary | ICD-10-CM | POA: Diagnosis not present

## 2015-05-22 DIAGNOSIS — H524 Presbyopia: Secondary | ICD-10-CM | POA: Diagnosis not present

## 2015-05-22 DIAGNOSIS — Z0100 Encounter for examination of eyes and vision without abnormal findings: Secondary | ICD-10-CM | POA: Diagnosis not present

## 2015-05-23 DIAGNOSIS — R69 Illness, unspecified: Secondary | ICD-10-CM | POA: Diagnosis not present

## 2015-06-26 DIAGNOSIS — I639 Cerebral infarction, unspecified: Secondary | ICD-10-CM | POA: Diagnosis not present

## 2015-06-26 DIAGNOSIS — I10 Essential (primary) hypertension: Secondary | ICD-10-CM | POA: Diagnosis not present

## 2015-06-26 DIAGNOSIS — S065X9A Traumatic subdural hemorrhage with loss of consciousness of unspecified duration, initial encounter: Secondary | ICD-10-CM | POA: Diagnosis not present

## 2015-06-26 DIAGNOSIS — M545 Low back pain: Secondary | ICD-10-CM | POA: Diagnosis not present

## 2015-11-27 DIAGNOSIS — R69 Illness, unspecified: Secondary | ICD-10-CM | POA: Diagnosis not present

## 2015-11-28 DIAGNOSIS — I10 Essential (primary) hypertension: Secondary | ICD-10-CM | POA: Diagnosis not present

## 2015-11-28 DIAGNOSIS — E7800 Pure hypercholesterolemia, unspecified: Secondary | ICD-10-CM | POA: Diagnosis not present

## 2015-11-28 DIAGNOSIS — M1990 Unspecified osteoarthritis, unspecified site: Secondary | ICD-10-CM | POA: Diagnosis not present

## 2015-11-28 DIAGNOSIS — Z13220 Encounter for screening for lipoid disorders: Secondary | ICD-10-CM | POA: Diagnosis not present

## 2015-12-05 DIAGNOSIS — Z0000 Encounter for general adult medical examination without abnormal findings: Secondary | ICD-10-CM | POA: Diagnosis not present

## 2016-02-11 DIAGNOSIS — K625 Hemorrhage of anus and rectum: Secondary | ICD-10-CM | POA: Diagnosis not present

## 2016-02-11 DIAGNOSIS — Z6824 Body mass index (BMI) 24.0-24.9, adult: Secondary | ICD-10-CM | POA: Diagnosis not present

## 2016-02-14 DIAGNOSIS — Z86010 Personal history of colonic polyps: Secondary | ICD-10-CM | POA: Diagnosis not present

## 2016-02-14 DIAGNOSIS — K625 Hemorrhage of anus and rectum: Secondary | ICD-10-CM | POA: Diagnosis not present

## 2016-02-27 DIAGNOSIS — M1990 Unspecified osteoarthritis, unspecified site: Secondary | ICD-10-CM | POA: Diagnosis not present

## 2016-02-27 DIAGNOSIS — K5790 Diverticulosis of intestine, part unspecified, without perforation or abscess without bleeding: Secondary | ICD-10-CM | POA: Diagnosis not present

## 2016-02-27 DIAGNOSIS — Z79899 Other long term (current) drug therapy: Secondary | ICD-10-CM | POA: Diagnosis not present

## 2016-02-27 DIAGNOSIS — K644 Residual hemorrhoidal skin tags: Secondary | ICD-10-CM | POA: Diagnosis not present

## 2016-02-27 DIAGNOSIS — Z8673 Personal history of transient ischemic attack (TIA), and cerebral infarction without residual deficits: Secondary | ICD-10-CM | POA: Diagnosis not present

## 2016-02-27 DIAGNOSIS — I10 Essential (primary) hypertension: Secondary | ICD-10-CM | POA: Diagnosis not present

## 2016-02-27 DIAGNOSIS — K219 Gastro-esophageal reflux disease without esophagitis: Secondary | ICD-10-CM | POA: Diagnosis not present

## 2016-02-27 DIAGNOSIS — K5730 Diverticulosis of large intestine without perforation or abscess without bleeding: Secondary | ICD-10-CM | POA: Diagnosis not present

## 2016-02-27 DIAGNOSIS — K625 Hemorrhage of anus and rectum: Secondary | ICD-10-CM | POA: Diagnosis not present

## 2016-02-27 DIAGNOSIS — D125 Benign neoplasm of sigmoid colon: Secondary | ICD-10-CM | POA: Diagnosis not present

## 2016-02-27 DIAGNOSIS — Z86010 Personal history of colonic polyps: Secondary | ICD-10-CM | POA: Diagnosis not present

## 2016-04-13 ENCOUNTER — Telehealth: Payer: Self-pay | Admitting: Family Medicine

## 2016-04-13 DIAGNOSIS — Z79899 Other long term (current) drug therapy: Secondary | ICD-10-CM | POA: Diagnosis not present

## 2016-04-13 DIAGNOSIS — Z7982 Long term (current) use of aspirin: Secondary | ICD-10-CM | POA: Diagnosis not present

## 2016-04-13 DIAGNOSIS — I10 Essential (primary) hypertension: Secondary | ICD-10-CM | POA: Diagnosis not present

## 2016-04-13 DIAGNOSIS — R0489 Hemorrhage from other sites in respiratory passages: Secondary | ICD-10-CM | POA: Diagnosis not present

## 2016-04-13 DIAGNOSIS — R042 Hemoptysis: Secondary | ICD-10-CM | POA: Diagnosis not present

## 2016-04-13 DIAGNOSIS — Z8673 Personal history of transient ischemic attack (TIA), and cerebral infarction without residual deficits: Secondary | ICD-10-CM | POA: Diagnosis not present

## 2016-04-13 NOTE — Telephone Encounter (Signed)
Accepted to [facility] stepdown bed under inpt status for hemoptysis. From [place] (Dr. Vernie Murders), where he presents with scant hemoptysis yesterday (preceded by cpl days cough), then a total of 20-30 cc fresh blood over the course of today. Afebrile VSS, normal coags, stable respiratorations. CT with suspected pulmonary hemorrhage on a background of healthy-appearing lung. EDP not concerned for airway compromise; pt not coughing up that much blood and handling it well. Hx of non-traumatic subdural hematomas in 2013 requiring burr hole.

## 2016-04-14 ENCOUNTER — Encounter (HOSPITAL_COMMUNITY): Payer: Self-pay | Admitting: Internal Medicine

## 2016-04-14 ENCOUNTER — Inpatient Hospital Stay (HOSPITAL_COMMUNITY)
Admission: AD | Admit: 2016-04-14 | Discharge: 2016-04-17 | DRG: 167 | Disposition: A | Discharge status: Home Health | Payer: Medicare HMO | Source: Other Acute Inpatient Hospital | Attending: Internal Medicine | Admitting: Internal Medicine

## 2016-04-14 DIAGNOSIS — R7303 Prediabetes: Secondary | ICD-10-CM | POA: Diagnosis not present

## 2016-04-14 DIAGNOSIS — I10 Essential (primary) hypertension: Secondary | ICD-10-CM | POA: Diagnosis present

## 2016-04-14 DIAGNOSIS — R918 Other nonspecific abnormal finding of lung field: Secondary | ICD-10-CM | POA: Diagnosis not present

## 2016-04-14 DIAGNOSIS — R739 Hyperglycemia, unspecified: Secondary | ICD-10-CM

## 2016-04-14 DIAGNOSIS — Z833 Family history of diabetes mellitus: Secondary | ICD-10-CM | POA: Diagnosis not present

## 2016-04-14 DIAGNOSIS — M5030 Other cervical disc degeneration, unspecified cervical region: Secondary | ICD-10-CM | POA: Diagnosis not present

## 2016-04-14 DIAGNOSIS — R042 Hemoptysis: Secondary | ICD-10-CM | POA: Diagnosis not present

## 2016-04-14 DIAGNOSIS — Z8673 Personal history of transient ischemic attack (TIA), and cerebral infarction without residual deficits: Secondary | ICD-10-CM

## 2016-04-14 DIAGNOSIS — Z8249 Family history of ischemic heart disease and other diseases of the circulatory system: Secondary | ICD-10-CM

## 2016-04-14 DIAGNOSIS — Z7982 Long term (current) use of aspirin: Secondary | ICD-10-CM | POA: Diagnosis not present

## 2016-04-14 DIAGNOSIS — M1990 Unspecified osteoarthritis, unspecified site: Secondary | ICD-10-CM | POA: Diagnosis present

## 2016-04-14 DIAGNOSIS — Z9889 Other specified postprocedural states: Secondary | ICD-10-CM

## 2016-04-14 DIAGNOSIS — I639 Cerebral infarction, unspecified: Secondary | ICD-10-CM | POA: Diagnosis present

## 2016-04-14 DIAGNOSIS — J189 Pneumonia, unspecified organism: Secondary | ICD-10-CM | POA: Diagnosis not present

## 2016-04-14 DIAGNOSIS — I77810 Thoracic aortic ectasia: Secondary | ICD-10-CM | POA: Diagnosis not present

## 2016-04-14 LAB — STREP PNEUMONIAE URINARY ANTIGEN: STREP PNEUMO URINARY ANTIGEN: NEGATIVE

## 2016-04-14 LAB — CBC WITH DIFFERENTIAL/PLATELET
BASOS ABS: 0 10*3/uL (ref 0.0–0.1)
Basophils Relative: 0 %
EOS PCT: 1 %
Eosinophils Absolute: 0.1 10*3/uL (ref 0.0–0.7)
HCT: 45.6 % (ref 39.0–52.0)
Hemoglobin: 15.2 g/dL (ref 13.0–17.0)
LYMPHS ABS: 1.7 10*3/uL (ref 0.7–4.0)
LYMPHS PCT: 19 %
MCH: 31.4 pg (ref 26.0–34.0)
MCHC: 33.3 g/dL (ref 30.0–36.0)
MCV: 94.2 fL (ref 78.0–100.0)
MONO ABS: 0.7 10*3/uL (ref 0.1–1.0)
MONOS PCT: 8 %
Neutro Abs: 6.3 10*3/uL (ref 1.7–7.7)
Neutrophils Relative %: 72 %
PLATELETS: 246 10*3/uL (ref 150–400)
RBC: 4.84 MIL/uL (ref 4.22–5.81)
RDW: 12.6 % (ref 11.5–15.5)
WBC: 8.8 10*3/uL (ref 4.0–10.5)

## 2016-04-14 LAB — MRSA PCR SCREENING: MRSA BY PCR: NEGATIVE

## 2016-04-14 LAB — TYPE AND SCREEN
ABO/RH(D): AB NEG
ANTIBODY SCREEN: NEGATIVE

## 2016-04-14 LAB — COMPREHENSIVE METABOLIC PANEL
ALT: 18 U/L (ref 17–63)
ANION GAP: 10 (ref 5–15)
AST: 19 U/L (ref 15–41)
Albumin: 3.9 g/dL (ref 3.5–5.0)
Alkaline Phosphatase: 76 U/L (ref 38–126)
BUN: 9 mg/dL (ref 6–20)
CHLORIDE: 105 mmol/L (ref 101–111)
CO2: 26 mmol/L (ref 22–32)
Calcium: 9.4 mg/dL (ref 8.9–10.3)
Creatinine, Ser: 1.09 mg/dL (ref 0.61–1.24)
Glucose, Bld: 103 mg/dL — ABNORMAL HIGH (ref 65–99)
POTASSIUM: 4.2 mmol/L (ref 3.5–5.1)
Sodium: 141 mmol/L (ref 135–145)
Total Bilirubin: 0.8 mg/dL (ref 0.3–1.2)
Total Protein: 7.1 g/dL (ref 6.5–8.1)

## 2016-04-14 LAB — TROPONIN I: Troponin I: <0.03 ng/mL (ref <0.03)

## 2016-04-14 LAB — HIV ANTIBODY (ROUTINE TESTING W REFLEX): HIV SCREEN 4TH GENERATION: NONREACTIVE

## 2016-04-14 LAB — PROTIME-INR
INR: 1.16
PROTHROMBIN TIME: 14.9 s (ref 11.4–15.2)

## 2016-04-14 LAB — URINALYSIS, ROUTINE W REFLEX MICROSCOPIC
Bilirubin Urine: NEGATIVE
Glucose, UA: NEGATIVE mg/dL
HGB URINE DIPSTICK: NEGATIVE
KETONES UR: NEGATIVE mg/dL
LEUKOCYTES UA: NEGATIVE
Nitrite: NEGATIVE
PROTEIN: NEGATIVE mg/dL
Specific Gravity, Urine: 1.018 (ref 1.005–1.030)
pH: 7.5 (ref 5.0–8.0)

## 2016-04-14 LAB — EXPECTORATED SPUTUM ASSESSMENT W GRAM STAIN, RFLX TO RESP C

## 2016-04-14 MED ORDER — ACETAMINOPHEN 650 MG RE SUPP: 650.0000 mg | Freq: Four times a day (QID) | RECTAL | Status: DC | PRN

## 2016-04-14 MED ORDER — MELATONIN 3 MG PO TABS
3.0000 | ORAL_TABLET | Freq: Every day | ORAL | Status: DC
  Administered 2016-04-14 – 2016-04-16 (×3): 9 mg via ORAL
  Filled 2016-04-14 (×3): qty 3

## 2016-04-14 MED ORDER — ONDANSETRON HCL 4 MG PO TABS: 4.0000 mg | ORAL_TABLET | Freq: Four times a day (QID) | ORAL | Status: DC | PRN

## 2016-04-14 MED ORDER — SODIUM CHLORIDE 0.9 % IV SOLN
INTRAVENOUS | Status: AC
  Administered 2016-04-14: 1000 mL via INTRAVENOUS

## 2016-04-14 MED ORDER — AZITHROMYCIN 500 MG IV SOLR
500.0000 mg | INTRAVENOUS | Status: DC
  Administered 2016-04-14 – 2016-04-16 (×2): 500 mg via INTRAVENOUS
  Filled 2016-04-14 (×3): qty 500

## 2016-04-14 MED ORDER — HYDRALAZINE HCL 20 MG/ML IJ SOLN: 10.0000 mg | INTRAMUSCULAR | Status: DC | PRN

## 2016-04-14 MED ORDER — CEFTRIAXONE SODIUM 1 G IJ SOLR
1.0000 g | INTRAMUSCULAR | Status: DC
  Administered 2016-04-14 – 2016-04-17 (×4): 1 g via INTRAVENOUS
  Filled 2016-04-14 (×5): qty 10

## 2016-04-14 MED ORDER — ACETAMINOPHEN 325 MG PO TABS
650.0000 mg | ORAL_TABLET | Freq: Four times a day (QID) | ORAL | Status: DC | PRN
  Filled 2016-04-14: qty 2

## 2016-04-14 MED ORDER — ONDANSETRON HCL 4 MG/2ML IJ SOLN: 4.0000 mg | Freq: Four times a day (QID) | INTRAMUSCULAR | Status: DC | PRN

## 2016-04-14 NOTE — Progress Notes (Signed)
Johnathan Reynolds - Stepdown/ICU TEAM  Johnathan Reynolds  [id number] DOB: 05-19-1941 DOA: 04/14/2016 PCP: Rory Percy, MD    Brief Narrative:  75 y.o. male with history of stroke and hypertension who began experiencing hemoptysis 2 days prior to this admit described as coughing up small clots. Over 48hrs this progressed to multiple episodes of coughing up  frank blood. He denied fever or chills, night sweats, or recent travel.  He experienced some blood in the stools 2 months prior and a colonoscopy noted only a polyp. Labs at [facility] showed hemoglobin of 15 and blood glucose of 196. UA did not show any blood cells. Creatinine and platelets were normal. Patient does take aspirin for stroke.   CT chest in the ED noted patchy airspace process within the right lung worse over the right apex with opacification within the right upper lobe bronchus. Mild ectasia of the ascending thoracic aorta measuring 3.4 cm.  Patient was transferred to [facility] for further workup. After arrival patient had mild hemoptysis.  Subjective: Pt is seen for a f/u visit.    Assessment & Plan:  Hemoptysis  HTN  Hx of CVA  Hyperglycemia   DVT prophylaxis: SCDs Code Status: FULL CODE Family Communication: no family present at time of exam  Disposition Plan: SDU  Consultants:  PCCM  Procedures: none  Antimicrobials:  Azithro Rocephin  Objective: Blood pressure 123/80, pulse 85, temperature 97.5 F (36.4 C), temperature source Oral, resp. rate 13, height 5\' 10"  (Reynolds.778 m), weight 77.3 kg (170 lb 6.7 oz), SpO2 96 %.  Intake/Output Summary (Last 24 hours) at 04/14/16 1330 Last data filed at 04/14/16 1200  Gross per 24 hour  Intake           726.25 ml  Output                0 ml  Net           726.25 ml   Filed Weights   04/14/16 0415 04/14/16 0433  Weight: 77.3 kg (170 lb 6.7 oz) 77.3 kg (170 lb 6.7 oz)    Examination: Pt was seen for a f/u visit.    CBC:  Recent  Labs Lab 04/14/16 0752  WBC 8.8  NEUTROABS 6.3  HGB 15.2  HCT 45.6  MCV 94.2  PLT 0000000   Basic Metabolic Panel:  Recent Labs Lab 04/14/16 0752  NA 141  K 4.2  CL 105  CO2 26  GLUCOSE 103*  BUN 9  CREATININE Reynolds.09  CALCIUM 9.4   GFR: Estimated Creatinine Clearance: 59.5 mL/min (by C-G formula based on SCr of Reynolds.09 mg/dL).  Liver Function Tests:  Recent Labs Lab 04/14/16 0752  AST 19  ALT 18  ALKPHOS 76  BILITOT 0.8  PROT 7.Reynolds  ALBUMIN 3.9    Coagulation Profile:  Recent Labs Lab 04/14/16 0752  INR Reynolds.16    Cardiac Enzymes:  Recent Labs Lab 04/14/16 0752  TROPONINI <0.03    HbA1C: No results found for: HGBA1C  CBG: No results for input(s): GLUCAP in the last 168 hours.  Recent Results (from the past 240 hour(s))  MRSA PCR Screening     Status: None   Collection Time: 04/14/16  5:03 AM  Result Value Ref Range Status   MRSA by PCR NEGATIVE NEGATIVE Final    Comment:        The GeneXpert MRSA Assay (FDA approved for NASAL specimens only), is one component of a comprehensive MRSA colonization  surveillance program. It is not intended to diagnose MRSA infection nor to guide or monitor treatment for MRSA infections.      Scheduled Meds: . azithromycin  500 mg Intravenous Q24H  . cefTRIAXone (ROCEPHIN)  IV  Reynolds g Intravenous Q24H   Continuous Infusions: . sodium chloride Reynolds,000 mL (04/14/16 0619)     LOS: 0 days   Time spent: No Charge  Cherene Altes, MD [facility] Office  [phone] Pager - Text Page per Amion as per below:  On-Call/Text Page:      [email or web address]      password [email or web address]  If 7PM-7AM, please contact night-coverage [email or web address] Password [email or web address] 04/14/2016, Reynolds:30 PM

## 2016-04-14 NOTE — H&P (Signed)
History and Physical    Johnathan Reynolds [id number] DOB: [birth year] DOA: 04/14/2016  PCP: Rory Percy, MD  Patient coming from: [facility].  Chief Complaint: Hemoptysis.  HPI: Johnathan Reynolds is a 75 y.o. male with history of stroke, hypertension started experiencing hemoptysis 2 days ago. Patient states 2 days ago he had started coughing small clots. Last evening when he was having his car washed he started having multiple episodes of frank blood coughing up. Denies any fever or chills night sweats, any recent travel or sick contacts. Has had 6 pounds of weight loss. Patient states he experienced some blood in the stools 2 months ago and had a colonoscopy which had a polyp. Since then he has not had any further episodes of blood in the stools. Denies any hematuria. Labs done at [facility] showed hemoglobin of 15 and blood glucose of 196. UA did not show any blood cells. Creatinine and platelets are normal. Patient does take aspirin for stroke. Last dose was 3 days ago.  CT scan showing patchy airspace process within the right lung worse over the right upper lobe apex findings may be due to pneumonia cannot exclude parenchymal hemorrhage over the right apex. Opacification within the right upper lobe bronchus may be due to hemorrhage versus aspiration. Mild ectasia of the ascending thoracic aorta measuring 3.4 cm.   Patient was transferred to [facility] for further workup. After arrival patient had mild hemoptysis.  ED Course: Patient was a direct transfer.  Review of Systems: As per HPI, rest all negative.   Past Medical History:  Diagnosis Date  . DDD (degenerative disc disease), cervical   . DJD (degenerative joint disease)   . Hypertension   . Nerve pain   . Stroke ([place]) 123456   embolic, evaluated at [facility]  . Subdural hematoma ([place]) 10/2011   presumed spontaneous, though he had had a fall 2 months prior with head injury    Past Surgical  History:  Procedure Laterality Date  . CRANIOTOMY  11/15/2011   Procedure: CRANIOTOMY HEMATOMA EVACUATION SUBDURAL;  Surgeon: Otilio Connors, MD;  Location: [place] [facility] OR[facility];  Service: Neurosurgery;  Laterality: N/A;  Bilateral [facility] Drainage for subdural hematoma  . LAMINECTOMY     twice, most recently 1996     reports that he has never smoked. He has never used smokeless tobacco. He reports that he drinks alcohol. He reports that he does not use drugs.  No Known Allergies  Family History  Problem Relation Age of Onset  . CAD      grandparents and mother  . CAD Mother   . Diabetes Mellitus II Maternal Grandmother     Prior to Admission medications   Medication Sig Start Date End Date Taking? Authorizing Provider  acetaminophen (TYLENOL) 650 MG CR tablet Take 1,300 mg by mouth 2 (two) times daily.    Historical Provider, MD  Calcium Carb-Cholecalciferol 600-800 MG-UNIT TABS Take 1 tablet by mouth daily.    Historical Provider, MD  gabapentin (NEURONTIN) 300 MG capsule Take 300 mg by mouth in the morning and take 600 mg by mouth in the evening    Historical Provider, MD  lisinopril (PRINIVIL,ZESTRIL) 5 MG tablet Take 5 mg by mouth daily.    Historical Provider, MD  Multiple Vitamin (MULITIVITAMIN WITH MINERALS) TABS Take 1 tablet by mouth daily.    Historical Provider, MD    Physical Exam: Vitals:   04/14/16 0415  BP: 136/89  Pulse: 83  Temp: 97.9 F (36.6 C)  TempSrc: Oral  SpO2: 97%  Weight: 170 lb 6.7 oz (77.3 kg)  Height: 5\' 10"  (1.778 m)      Constitutional: Not in distress. Vitals:   04/14/16 0415  BP: 136/89  Pulse: 83  Temp: 97.9 F (36.6 C)  TempSrc: Oral  SpO2: 97%  Weight: 170 lb 6.7 oz (77.3 kg)  Height: 5\' 10"  (1.778 m)   Eyes: Anicteric. No pallor. ENMT: No discharge from the ears eyes nose and mouth. Neck: No mass felt. No JVD appreciated. Respiratory: No rhonchi or crepitations. Cardiovascular: S1 and S2 heard. Abdomen: Soft nontender bowel  sounds present. No guarding or rigidity. Musculoskeletal: No edema. Skin: No rash. Neurologic: Alert awake oriented to time place and person. Moves all extremities. Psychiatric: Appears normal.   Labs on Admission: I have personally reviewed following labs and imaging studies  CBC: No results for input(s): WBC, NEUTROABS, HGB, HCT, MCV, PLT in the last 168 hours. Basic Metabolic Panel: No results for input(s): NA, K, CL, CO2, GLUCOSE, BUN, CREATININE, CALCIUM, MG, PHOS in the last 168 hours. GFR: CrCl cannot be calculated (Patient's most recent lab result is older than the maximum 21 days allowed.). Liver Function Tests: No results for input(s): AST, ALT, ALKPHOS, BILITOT, PROT, ALBUMIN in the last 168 hours. No results for input(s): LIPASE, AMYLASE in the last 168 hours. No results for input(s): AMMONIA in the last 168 hours. Coagulation Profile: No results for input(s): INR, PROTIME in the last 168 hours. Cardiac Enzymes: No results for input(s): CKTOTAL, CKMB, CKMBINDEX, TROPONINI in the last 168 hours. BNP (last 3 results) No results for input(s): PROBNP in the last 8760 hours. HbA1C: No results for input(s): HGBA1C in the last 72 hours. CBG: No results for input(s): GLUCAP in the last 168 hours. Lipid Profile: No results for input(s): CHOL, HDL, LDLCALC, TRIG, CHOLHDL, LDLDIRECT in the last 72 hours. Thyroid Function Tests: No results for input(s): TSH, T4TOTAL, FREET4, T3FREE, THYROIDAB in the last 72 hours. Anemia Panel: No results for input(s): VITAMINB12, FOLATE, FERRITIN, TIBC, IRON, RETICCTPCT in the last 72 hours. Urine analysis:    Component Value Date/Time   COLORURINE YELLOW 11/15/2011 [place] 11/15/2011 1652   LABSPEC 1.019 11/15/2011 1652   PHURINE 5.0 11/15/2011 1652   GLUCOSEU NEGATIVE 11/15/2011 1652   HGBUR NEGATIVE 11/15/2011 1652   BILIRUBINUR NEGATIVE 11/15/2011 1652   KETONESUR 40 (A) 11/15/2011 1652   PROTEINUR NEGATIVE  11/15/2011 1652   UROBILINOGEN 0.2 11/15/2011 1652   NITRITE NEGATIVE 11/15/2011 1652   LEUKOCYTESUR NEGATIVE 11/15/2011 1652   Sepsis Labs: @LABRCNTIP (procalcitonin:4,lacticidven:4) )No results found for this or any previous visit (from the past 240 hour(s)).   Radiological Exams on Admission: No results found.   Assessment/Plan Principal Problem:   Hemoptysis Active Problems:   Stroke [facility])   Essential hypertension    #1. Severe hemoptysis - cause not clear. Patient is afebrile. Given the differentials including pneumonia patient is placed empirically on Zithromax and ceftriaxone. Patient's creatinine and UA were unremarkable at the [place] gold assay and until then patient will be on airborne precautions. I have consulted pulmonary critical care for further advice and patient will be nothing by mouth until then in anticipation of possible bronchoscopy. #2. Hypertension - since patient is nothing by mouth I have placed patient on when necessary IV hydralazine. #3. History of stroke - given the massive hemoptysis we'll be holding off patient's aspirin. #4. Hyperglycemia - check hemoglobin A1c.  Repeat labs including CBC metabolic panel INR are pending.   DVT prophylaxis: SCDs. Code Status: Full code.  Family Communication: Discussed with patient.  Disposition Plan: Home.  Consults called: Pulmonary critical care.  Admission status: Inpatient.    Rise Patience MD [facility] Pager [phone].  If 7PM-7AM, please contact night-coverage [email or web address] Password [facility]  04/14/2016, 5:38 AM

## 2016-04-14 NOTE — Care Management Note (Addendum)
Case Management Note  Patient Details  Name: Johnathan Reynolds MRN: [id number] Date of Birth: 03-Mar-1941  Subjective/Objective:       Patient presents with  Hemoptysis, r/o TB, Quantiferon Pending.  He lives home alone, pta indep.  He has a pcp, and he has medication coverage.  He states one of his sisters will transport him home at discharge.  NCM will cont to follow for dc needs.    9/19 Patient is for Bronch at 2:30 pm today by critical care.             Action/Plan:   Expected Discharge Date:  04/18/16               Expected Discharge Plan:  Home/Self Care  In-House Referral:     Discharge planning Services  CM Consult  Post Acute Care Choice:    Choice offered to:     DME Arranged:    DME Agency:     HH Arranged:    HH Agency:     Status of Service:  Completed, signed off  If discussed at [employer] of Stay Meetings, dates discussed:    Additional Comments:  Zenon Mayo, RN 04/14/2016, 5:40 PM

## 2016-04-14 NOTE — Consult Note (Signed)
Name: Johnathan Reynolds MRN: [id number] DOB: Feb 15, 1941    ADMISSION DATE:  04/14/2016 CONSULTATION DATE:  04/14/16  REFERRING MD :  Dr. 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    The patient reports symptoms began 48 hours prior to presentation.  He initially noted symptoms on Friday night (9/15) after going to bed.  He got up feeling like he needed to clear his throat.  When he did, he coughed up a couple small red clots.  He had no further bleeding on Saturday.  On Sunday 9/17 late evening he went to wash his car. While washing his car he felt the urge again to clear his throat and when he did he coughed up bright red blood. The patient initially thought he would go home however on the way home he began coughing up more blood. In all he feels he probably coughed up greater than one cup of blood. He sought care at [facility].  A CTA of the chest was completed which showed a right upper lobe/apex opacity, opacification within the right upper lobe bronchus, mild ectasia of the ascending thoracic aorta and fluid within the mid to distal esophagus (patient reports he had just eaten prior to presentation).  The patient was transferred to [facility] for further evaluation of hemoptysis.  He denies chest pain, cough, nasal drainage, postnasal drip, epistaxis, seasonal allergies, URI symptoms, pain with inspiration, presyncope/syncope, further blood in stool, weakness/fatigue, fevers, chills. Patient reports he feels normal short of coughing up blood.  Denies known TB  risk / exposures.  Reports an uncle had TB 50+ years ago.  He denies difficulty with swallowing/drinking. He does report occasional hoarseness but no sore throat.  He does note that he had a 6 pound weight loss from May 2017 to early July 2017. He denies significant loss of appetite. However he has been trying not to overeat.  Initial labs-CBC 8.8, Hgb 15.2, platelets 242, NA 141, creatinine 1.09, INR 1.16, PT 14.9.  PAST MEDICAL HISTORY :   has a past medical history of DDD (degenerative disc disease), cervical; DJD (degenerative joint disease); Hypertension; Nerve pain; Stroke ([place]) (11/13/14); and Subdural hematoma ([place]) (10/2011).  has a past surgical history that includes Craniotomy (11/15/2011) and Laminectomy.   Prior to Admission medications   Medication Sig Start Date End Date Taking? Authorizing Provider  acetaminophen (TYLENOL) 650 MG CR tablet Take 1,300 mg by mouth every 8 (eight) hours as needed for pain.    Yes Historical Provider, MD  Calcium Carb-Cholecalciferol 600-800 MG-UNIT TABS Take 1 tablet by mouth daily.   Yes Historical Provider, MD  gabapentin (NEURONTIN) 300 MG capsule Take 300 mg by mouth in the morning and take 600 mg by mouth in the evening   Yes Historical Provider, MD  lisinopril (PRINIVIL,ZESTRIL) 5 MG tablet Take 5 mg by mouth daily.   Yes Historical Provider, MD  Melatonin 10 MG TABS Take 1 tablet by mouth at bedtime.   Yes Historical Provider, MD  Multiple Vitamin (MULITIVITAMIN WITH  MINERALS) TABS Take 1 tablet by mouth daily.   Yes Historical Provider, MD   No Known Allergies  FAMILY HISTORY:  family history includes CAD in his mother; Diabetes Mellitus II in his maternal grandmother.   SOCIAL HISTORY:  reports that he has never smoked. He has never used smokeless tobacco. He reports that he drinks alcohol. He reports that he does not use drugs.  REVIEW OF SYSTEMS:  POSITIVES IN BOLD  Constitutional: Negative for fever, chills, weight loss,  malaise/fatigue and diaphoresis.  HENT: Negative for hearing loss, ear pain, nosebleeds, congestion, sore throat, neck pain, tinnitus and ear discharge.   Eyes: Negative for blurred vision, double vision, photophobia, pain, discharge and redness.  Respiratory: Negative for cough, hemoptysis, sputum production, shortness of breath, wheezing and stridor.   Cardiovascular: Negative for chest pain, palpitations, orthopnea, claudication, leg swelling and PND.  Gastrointestinal: Negative for heartburn, nausea, vomiting, abdominal pain, diarrhea, constipation, blood in stool and melena.  Genitourinary: Negative for dysuria, urgency, frequency, hematuria and flank pain.  Musculoskeletal: Negative for myalgias, back pain, joint pain and falls.  Skin: Negative for itching and rash.  Neurological: Negative for dizziness, tingling, tremors, sensory change, speech change, focal weakness, seizures, loss of consciousness, weakness and headaches.  Endo/Heme/Allergies: Negative for environmental allergies and polydipsia. Does not bruise/bleed easily.  SUBJECTIVE:   VITAL SIGNS: Temp:  [97.5 F (36.4 C)-97.9 F (36.6 C)] 97.5 F (36.4 C) (09/18 1155) Pulse Rate:  [83-85] 85 (09/18 1155) Resp:  [13-18] 13 (09/18 1155) BP: (123-136)/(80-89) 123/80 (09/18 1155) SpO2:  [96 %-97 %] 96 % (09/18 1155) Weight:  [170 lb 6.7 oz (77.3 kg)] 170 lb 6.7 oz (77.3 kg) (09/18 0433)  PHYSICAL EXAMINATION: General:  Well developed adult male in NAD  Neuro:  AAOx4, speech clear, MAE  HEENT:  MM pink/moist, no jvd, no LAN  Cardiovascular:  s1s2 rrr, no m/r/g, SR on monitor  Lungs:  Even/non-labored, lungs bilaterally clear  Abdomen:  Soft, non-tender, bsx4 active  Musculoskeletal:  No acute deformities  Skin:  Warm/dry, no edema    Recent Labs Lab 04/14/16 0752  NA 141  K 4.2  CL 105  CO2 26  BUN 9  CREATININE 1.09  GLUCOSE 103*    Recent Labs Lab 04/14/16 0752  HGB 15.2  HCT 45.6  WBC 8.8  PLT 246    No results found.   SIGNIFICANT EVENTS  9/18  Admit with hemopytis  STUDIES:  CTA Chest 9/17 [facility]) >> patchy airspace process within the RUL/apex (infection vs hemorrhage), opacification within the RUL bronchus may be due to hemorrhage vs aspiration, no effusion, mild ectasia of the ascending thoracic aorta measuring 3.4 cm (annual follow up recommended), fluid within the mid to distal esophagus may be due to dysmotility vs reflux, sub cm hypodensity over the R lobe of the liver likely a small cyst or hemangioma but too small to characterize    ASSESSMENT / PLAN:   Hemoptysis - unclear etiology, RUL/apex opacity, soft hazy area throughout all R lung.  Never smoker.  No viral prodrome / URI with provoking cough, no hx of autoimmune disorders, no recent epistaxis or aspiration, no hx of TB or PNA.  No recent instrumentation.     Plan: Plan for FOB for airway inspection 9/19.   NPO after MN   Monitor hemoptysis volume HOLD home ASA  Follow sputum culture Trend CBC Assess quantiferon gold If FOB unrevealing, may need ENT evaluation for upper airway review Continue Airborne Precautions for now  Noe Gens, NP-C [facility] Pgr: [phone] or if no answer [phone] 04/14/2016, 2:08 PM  75 yo male never smoker with hemoptysis.  He takes aspirin.  Denies fever, or epistaxis.  Has gum bleeding intermittently.  Denies joint swelling, skin rash.  Was tx for reflux recently.  Denies dysphagia.  No recent exposures.  Alert, no sinus tenderness, no oral lesions, MP 2, no LAN.  HR regular, no murmur.  No wheeze/rales.  Abd soft, non tender.  No edema, no rashes.  CT chest - GGO RUL   Assessment/plan:  Hemoptysis with RUL GGO infiltrate. - bronchoscopy scheduled for 230 pm on 04/15/16  - risks detailed as bleeding, infection, pneumothorax, and non diagnosis - will plan to send for BAL, AFB, Fungal cultures, and cytology - hold aspirin  Chesley Mires,  MD [place] 04/14/2016, 3:28 PM Pager:  [phone] After 3pm call: [phone]

## 2016-04-15 ENCOUNTER — Encounter (HOSPITAL_COMMUNITY)
Admission: AD | Disposition: A | Discharge status: Home Health | Payer: Self-pay | Source: Other Acute Inpatient Hospital | Attending: Internal Medicine

## 2016-04-15 ENCOUNTER — Inpatient Hospital Stay (HOSPITAL_COMMUNITY): Payer: Medicare HMO

## 2016-04-15 ENCOUNTER — Encounter (HOSPITAL_COMMUNITY): Payer: Medicare HMO

## 2016-04-15 DIAGNOSIS — Z9889 Other specified postprocedural states: Secondary | ICD-10-CM

## 2016-04-15 DIAGNOSIS — R739 Hyperglycemia, unspecified: Secondary | ICD-10-CM

## 2016-04-15 DIAGNOSIS — J189 Pneumonia, unspecified organism: Secondary | ICD-10-CM

## 2016-04-15 HISTORY — PX: VIDEO BRONCHOSCOPY: SHX5072

## 2016-04-15 LAB — BASIC METABOLIC PANEL
ANION GAP: 8 (ref 5–15)
BUN: 11 mg/dL (ref 6–20)
CALCIUM: 8.9 mg/dL (ref 8.9–10.3)
CHLORIDE: 106 mmol/L (ref 101–111)
CO2: 23 mmol/L (ref 22–32)
CREATININE: 1.07 mg/dL (ref 0.61–1.24)
GFR calc non Af Amer: >60 mL/min (ref >60)
GLUCOSE: 132 mg/dL — AB (ref 65–99)
Potassium: 3.9 mmol/L (ref 3.5–5.1)
Sodium: 137 mmol/L (ref 135–145)

## 2016-04-15 LAB — HEMOGLOBIN A1C
Hgb A1c MFr Bld: 5.7 % — ABNORMAL HIGH (ref 4.8–5.6)
MEAN PLASMA GLUCOSE: 117 mg/dL

## 2016-04-15 LAB — CBC
HEMATOCRIT: 43.2 % (ref 39.0–52.0)
HEMOGLOBIN: 13.9 g/dL (ref 13.0–17.0)
MCH: 30.5 pg (ref 26.0–34.0)
MCHC: 32.2 g/dL (ref 30.0–36.0)
MCV: 94.7 fL (ref 78.0–100.0)
Platelets: 224 10*3/uL (ref 150–400)
RBC: 4.56 MIL/uL (ref 4.22–5.81)
RDW: 12.9 % (ref 11.5–15.5)
WBC: 7.1 10*3/uL (ref 4.0–10.5)

## 2016-04-15 LAB — LEGIONELLA PNEUMOPHILA SEROGP 1 UR AG: L. pneumophila Serogp 1 Ur Ag: NEGATIVE

## 2016-04-15 SURGERY — VIDEO BRONCHOSCOPY WITHOUT FLUORO
Anesthesia: Moderate Sedation | Laterality: Bilateral

## 2016-04-15 MED ORDER — LIDOCAINE HCL 1 % IJ SOLN: INTRAMUSCULAR | Status: DC | PRN

## 2016-04-15 MED ORDER — MIDAZOLAM HCL 10 MG/2ML IJ SOLN
INTRAMUSCULAR | Status: DC | PRN
  Administered 2016-04-15 (×2): 2 mg via INTRAVENOUS

## 2016-04-15 MED ORDER — FENTANYL CITRATE (PF) 100 MCG/2ML IJ SOLN
INTRAMUSCULAR | Status: DC | PRN
  Administered 2016-04-15 (×2): 50 ug via INTRAVENOUS

## 2016-04-15 MED ORDER — FENTANYL CITRATE (PF) 100 MCG/2ML IJ SOLN
INTRAMUSCULAR | Status: AC
  Filled 2016-04-15: qty 4

## 2016-04-15 MED ORDER — SODIUM CHLORIDE 0.9 % IV SOLN
INTRAVENOUS | Status: DC
  Administered 2016-04-15: 15:00:00 via INTRAVENOUS

## 2016-04-15 MED ORDER — LIDOCAINE HCL (PF) 1 % IJ SOLN
INTRAMUSCULAR | Status: DC | PRN
  Administered 2016-04-15: 6 mL

## 2016-04-15 MED ORDER — BUTAMBEN-TETRACAINE-BENZOCAINE 2-2-14 % EX AERO: 1.0000 | INHALATION_SPRAY | Freq: Once | CUTANEOUS | Status: DC

## 2016-04-15 MED ORDER — MIDAZOLAM HCL 5 MG/ML IJ SOLN
INTRAMUSCULAR | Status: AC
  Filled 2016-04-15: qty 2

## 2016-04-15 NOTE — Progress Notes (Signed)
Video bronchoscopy with washing intervention, brushing intervention, all vitals good thru out procedure. Called report to Leonie Man RN .

## 2016-04-15 NOTE — Op Note (Signed)
[facility] Cardiopulmonary Patient Name: Johnathan Reynolds Date: 04/15/2016 MRN: [id number] Attending MD: Chesley Mires , MD Date of Birth: 1941-07-01 CSN: Finalized Age: 75 Admit Type: Inpatient Gender: Male Procedure:            Bronchoscopy Indications:          Hemoptysis                       Hemoptysis with abnormal CXR Providers:            Chesley Mires, MD, Doris Cheadle RRT,RCP, Tammie Readling                        RRT,RCP Referring MD:         Dr. Malen Gauze Medicines:            2 ml of 1% lidocaine                       Fentanyl 100 mcg IV, Midazolam 4 mg IV, Lidocaine 1%                        applied to cords 2 mL Complications:        No immediate complications Estimated Blood Loss: Estimated blood loss: none. Procedure:            Pre-Anesthesia Assessment:                       - A History and Physical has been performed. The                        patient's medications, allergies and sensitivities have                        been reviewed.                       - The risks and benefits of the procedure and the                        sedation options and risks were discussed with the                        patient. All questions were answered and informed                        consent was obtained.                       - ASA Grade Assessment: II - A patient with mild                        systemic disease.                       - The anesthesia plan was to use moderate                        sedation/analgesia.                       After obtaining informed consent, the bronchoscope was  passed under direct vision. Throughout the procedure,                        the patient's blood pressure, pulse, and oxygen                        saturations were monitored continuously. the                        Bronchoscope was introduced through the mouth and                        advanced to the tracheobronchial tree of both  lungs.                        The patient tolerated the procedure well. The total                        duration of the procedure was 17 minutes. Scope In: 2:25:54 PM Scope Out: 2:33:48 PM Findings:      The oropharynx appears normal. The larynx appears normal. The vocal       cords appear normal. The subglottic space is normal. The trachea is of       normal caliber. The carina is sharp. The tracheobronchial tree was       examined to at least the first subsegmental level.      Right Lung Abnormalities: Old blood was found in the apical segment of       the right upper lobe (B1) and in the anterior segment of the right upper       lobe (B3) No obvious endobronchial lesions. BAL was performed in the RUL       apical segment (B1) of the lung and sent for routine cytology, aerobic       culture and AFB analysis & culture. 60 mL of fluid were instilled. 25 mL       were returned. The return was bloody. Transbronchial brushings were       obtained in the apical segment of the right upper lobe and sent for       routine cytology and AFB analysis & culture. Two samples were obtained. Impression:           - Hemoptysis with abnormal CXR                       - Blood was present in the apical segment of the right                        upper lobe (B1) and in the anterior segment of the                        right upper lobe (B3).                       - Bronchoalveolar lavage was performed.                       - Transbronchial brushings were obtained.                       - Hemoptysis: blood identified on bronchoscopy. Moderate Sedation:  Moderate (conscious) sedation was personally administered by the       endoscopist. The following parameters were monitored: oxygen saturation,       heart rate, blood pressure, and response to care. Total physician       intraservice time was 17 minutes. Recommendation:       - Await BAL, brushing, culture and cytology results. Procedure Code(s):     --- Professional ---                       [phone], Bronchoscopy, rigid or flexible, including                        fluoroscopic guidance, when performed; with bronchial                        alveolar lavage                       [phone], Bronchoscopy, rigid or flexible, including                        fluoroscopic guidance, when performed; with brushing or                        protected brushings Diagnosis Code(s):    --- Professional ---                       R04.2, Hemoptysis CPT copyright 2016 American [employer] Association. All rights reserved. The codes documented in this report are preliminary and upon coder review may  be revised to meet current compliance requirements. Chesley Mires, MD Chesley Mires, MD 04/15/2016 3:07:30 PM This report has been signed electronically. Number of Addenda: 0

## 2016-04-15 NOTE — Progress Notes (Addendum)
PROGRESS NOTE    Johnathan Reynolds  [id number] DOB: November 10, 1940 DOA: 04/14/2016 PCP: Rory Percy, MD   Brief Narrative:  75 y.o. WM PMHx HTN, CVA, SDH, DJD,   Started experiencing hemoptysis 2 days ago. Patient states 2 days ago he had started coughing small clots. Last evening when he was having his car washed he started having multiple episodes of frank blood coughing up. Denies any fever or chills night sweats, any recent travel or sick contacts. Has had 6 pounds of weight loss. Patient states he experienced some blood in the stools 2 months ago and had a colonoscopy which had a polyp. Since then he has not had any further episodes of blood in the stools. Denies any hematuria. Labs done at [facility] showed hemoglobin of 15 and blood glucose of 196. UA did not show any blood cells. Creatinine and platelets are normal. Patient does take aspirin for stroke. Last dose was 3 days ago.  CT scan showing patchy airspace process within the right lung worse over the right upper lobe apex findings may be due to pneumonia cannot exclude parenchymal hemorrhage over the right apex. Opacification within the right upper lobe bronchus may be due to hemorrhage versus aspiration. Mild ectasia of the ascending thoracic aorta measuring 3.4 cm.   Patient was transferred to [facility] for further workup. After arrival patient had mild hemoptysis.   Subjective: 9/19 A/O 4, NAD, negative hemoptysis since admission.   Assessment & Plan:   Principal Problem:   Hemoptysis Active Problems:   Stroke [facility])   Essential hypertension   S/P bronchoscopy   CAP (community acquired pneumonia)   Hyperglycemia   Hemoptysis -Resolved -Awaiting BAL/brushing findings -QuantiFERON pending -Monitor for additional bleeding  CAP? -Continue current antibiotics  HTN -Hydralazine IV PRN  CVA -No residual symptoms -Unable to place on antiplatelet secondary to  hemoptysis  Hyperglycemia/Prediabetes  -9/18 Hemoglobin A1c= 5.7   DVT prophylaxis: SCD Code Status: Full Family Communication: Whole family present, discuss plan of care, reviewed CT findings Disposition Plan: Per [facility] M   Consultants:  Dr.Vineet Sood PCCM   Procedures/Significant Events:  9/19 RUL  BAL w/ lavage & Transbronchial brushings      Cultures 9/18 MRSA by PCR negative 9/18 blood right arm 2 NGTD 9/18 sputum pending 9/19 BAL pending   Antimicrobials: Azithro Rocephin   Devices None   LINES / TUBES:  None    Continuous Infusions: . sodium chloride 10 mL/hr at 04/15/16 1439     Objective: Vitals:   04/15/16 1535 04/15/16 1540 04/15/16 1618 04/15/16 1917  BP: 139/72  111/68 109/78  Pulse: 83 79 86 100  Resp: 19 18 13  (!) 22  Temp:   98 F (36.7 C) 97.4 F (36.3 C)  TempSrc:   Oral Oral  SpO2: 94% 94% 96% 95%  Weight:      Height:        Intake/Output Summary (Last 24 hours) at 04/15/16 2002 Last data filed at 04/15/16 1919  Gross per 24 hour  Intake            818.5 ml  Output                2 ml  Net            816.5 ml   Filed Weights   04/14/16 0415 04/14/16 0433 04/15/16 1339  Weight: 77.3 kg (170 lb 6.7 oz) 77.3 kg (170 lb 6.7 oz) 77.1 kg (170 lb)  Examination:  General: A/O 4, NAD, No acute respiratory distress Eyes: negative scleral hemorrhage, negative anisocoria, negative icterus ENT: Negative Runny nose, negative gingival bleeding, Neck:  Negative scars, masses, torticollis, lymphadenopathy, JVD Lungs: positive crackles diffusely, negative wheezes, good air movement  Cardiovascular: Regular rate and rhythm without murmur gallop or rub normal S1 and S2 Abdomen: negative abdominal pain, nondistended, positive soft, bowel sounds, no rebound, no ascites, no appreciable mass Extremities: No significant cyanosis, clubbing, or edema bilateral lower extremities Skin: Negative rashes, lesions, ulcers Psychiatric:   Negative depression, negative anxiety, negative fatigue, negative mania  Central nervous system:  Cranial nerves II through XII intact, tongue/uvula midline, all extremities muscle strength 5/5, sensation intact throughout,negative dysarthria, negative expressive aphasia, negative receptive aphasia.  .     Data Reviewed: Care during the described time interval was provided by me .  I have reviewed this patient's available data, including medical history, events of note, physical examination, and all test results as part of my evaluation. I have personally reviewed and interpreted all radiology studies.  CBC:  Recent Labs Lab 04/14/16 0752 04/15/16 0439  WBC 8.8 7.1  NEUTROABS 6.3  --   HGB 15.2 13.9  HCT 45.6 43.2  MCV 94.2 94.7  PLT 246 [id number]   Basic Metabolic Panel:  Recent Labs Lab 04/14/16 0752 04/15/16 0439  NA 141 137  K 4.2 3.9  CL 105 106  CO2 26 23  GLUCOSE 103* 132*  BUN 9 11  CREATININE 1.09 1.07  CALCIUM 9.4 8.9   GFR: Estimated Creatinine Clearance: 60.6 mL/min (by C-G formula based on SCr of 1.07 mg/dL). Liver Function Tests:  Recent Labs Lab 04/14/16 0752  AST 19  ALT 18  ALKPHOS 76  BILITOT 0.8  PROT 7.1  ALBUMIN 3.9   No results for input(s): LIPASE, AMYLASE in the last 168 hours. No results for input(s): AMMONIA in the last 168 hours. Coagulation Profile:  Recent Labs Lab 04/14/16 0752  INR 1.16   Cardiac Enzymes:  Recent Labs Lab 04/14/16 0752  TROPONINI <0.03   BNP (last 3 results) No results for input(s): PROBNP in the last 8760 hours. HbA1C:  Recent Labs  04/14/16 0752  HGBA1C 5.7*   CBG: No results for input(s): GLUCAP in the last 168 hours. Lipid Profile: No results for input(s): CHOL, HDL, LDLCALC, TRIG, CHOLHDL, LDLDIRECT in the last 72 hours. Thyroid Function Tests: No results for input(s): TSH, T4TOTAL, FREET4, T3FREE, THYROIDAB in the last 72 hours. Anemia Panel: No results for input(s): VITAMINB12, FOLATE,  FERRITIN, TIBC, IRON, RETICCTPCT in the last 72 hours. Urine analysis:    Component Value Date/Time   COLORURINE YELLOW 04/14/2016 0537   APPEARANCEUR CLEAR 04/14/2016 0537   LABSPEC 1.018 04/14/2016 0537   PHURINE 7.5 04/14/2016 0537   GLUCOSEU NEGATIVE 04/14/2016 0537   HGBUR NEGATIVE 04/14/2016 0537   BILIRUBINUR NEGATIVE 04/14/2016 0537   KETONESUR NEGATIVE 04/14/2016 0537   PROTEINUR NEGATIVE 04/14/2016 0537   UROBILINOGEN 0.2 11/15/2011 1652   NITRITE NEGATIVE 04/14/2016 0537   LEUKOCYTESUR NEGATIVE 04/14/2016 0537   Sepsis Labs: @LABRCNTIP (procalcitonin:4,lacticidven:4)  ) Recent Results (from the past 240 hour(s))  MRSA PCR Screening     Status: None   Collection Time: 04/14/16  5:03 AM  Result Value Ref Range Status   MRSA by PCR NEGATIVE NEGATIVE Final    Comment:        The GeneXpert MRSA Assay (FDA approved for NASAL specimens only), is one component of a comprehensive MRSA colonization surveillance program.  It is not intended to diagnose MRSA infection nor to guide or monitor treatment for MRSA infections.   Culture, blood (routine x 2) Call MD if unable to obtain prior to antibiotics being given     Status: None (Preliminary result)   Collection Time: 04/14/16  7:35 AM  Result Value Ref Range Status   Specimen Description BLOOD RIGHT ARM  Final   Special Requests BOTTLES DRAWN AEROBIC AND ANAEROBIC 10CC  Final   Culture NO GROWTH 1 DAY  Final   Report Status PENDING  Incomplete  Culture, blood (routine x 2) Call MD if unable to obtain prior to antibiotics being given     Status: None (Preliminary result)   Collection Time: 04/14/16  7:50 AM  Result Value Ref Range Status   Specimen Description BLOOD RIGHT FOREARM  Final   Special Requests BOTTLES DRAWN AEROBIC AND ANAEROBIC 5CC  Final   Culture NO GROWTH 1 DAY  Final   Report Status PENDING  Incomplete  Culture, sputum-assessment     Status: None   Collection Time: 04/14/16  4:06 PM  Result Value Ref  Range Status   Specimen Description SPUTUM  Final   Special Requests NONE  Final   Sputum evaluation   Final    THIS SPECIMEN IS ACCEPTABLE. RESPIRATORY CULTURE REPORT TO FOLLOW.   Report Status 04/14/2016 FINAL  Final  Culture, respiratory (NON-Expectorated)     Status: None (Preliminary result)   Collection Time: 04/14/16  4:06 PM  Result Value Ref Range Status   Specimen Description EXPECTORATED SPUTUM  Final   Special Requests NONE  Final   Gram Stain   Final    FEW WBC PRESENT,BOTH PMN AND MONONUCLEAR MODERATE GRAM POSITIVE COCCI IN PAIRS MODERATE GRAM VARIABLE ROD FEW SQUAMOUS EPITHELIAL CELLS PRESENT    Culture TOO YOUNG TO READ  Final   Report Status PENDING  Incomplete         Radiology Studies: Dg Chest Port 1 View  Result Date: 04/15/2016 CLINICAL DATA:  75 year old male status post bronchoscopy. EXAM: PORTABLE CHEST 1 VIEW COMPARISON:  Chest CTA 04/13/2016 and earlier. FINDINGS: Portable AP semi upright view at 1551 hours. Subtle patchy peripheral right upper lung opacity, might reflect lavage from recent bronchoscopy. No superimposed pneumothorax, pleural effusion, pulmonary edema or other confluent opacity. Mediastinal contours remain normal. Visualized tracheal air column is within normal limits. IMPRESSION: Mild nonspecific patchy right mid lung opacity which might reflect bronchoalveolar lavage in this setting. Otherwise stable chest. Electronically Signed   By: Genevie Ann M.D.   On: 04/15/2016 16:08        Scheduled Meds: . azithromycin  500 mg Intravenous Q24H  . cefTRIAXone (ROCEPHIN)  IV  1 g Intravenous Q24H  . Melatonin  3 tablet Oral QHS   Continuous Infusions: . sodium chloride 10 mL/hr at 04/15/16 1439     LOS: 1 day    Time spent: 40 minutes    WOODS, Geraldo Docker, MD [facility] Pager [phone]   If 7PM-7AM, please contact night-coverage [email or web address] Password [email or web address] 04/15/2016, 8:02 PM

## 2016-04-16 ENCOUNTER — Encounter (HOSPITAL_COMMUNITY): Payer: Self-pay | Admitting: Pulmonary Disease

## 2016-04-16 DIAGNOSIS — J189 Pneumonia, unspecified organism: Principal | ICD-10-CM

## 2016-04-16 DIAGNOSIS — I639 Cerebral infarction, unspecified: Secondary | ICD-10-CM

## 2016-04-16 DIAGNOSIS — I10 Essential (primary) hypertension: Secondary | ICD-10-CM

## 2016-04-16 LAB — ACID FAST SMEAR (AFB): ACID FAST SMEAR - AFSCU2: NEGATIVE

## 2016-04-16 LAB — CULTURE, RESPIRATORY W GRAM STAIN

## 2016-04-16 LAB — CULTURE, RESPIRATORY: CULTURE: NORMAL

## 2016-04-16 LAB — ACID FAST SMEAR (AFB, MYCOBACTERIA)

## 2016-04-16 MED ORDER — AZITHROMYCIN 500 MG PO TABS
500.0000 mg | ORAL_TABLET | Freq: Every day | ORAL | Status: DC
  Administered 2016-04-17: 500 mg via ORAL
  Filled 2016-04-16: qty 1

## 2016-04-16 NOTE — Progress Notes (Signed)
PROGRESS NOTE        PATIENT DETAILS Name: Johnathan Reynolds Age: 75 y.o. Sex: male Date of Birth: 22-May-1941 Admit Date: 04/14/2016 Admitting Physician Vianne Bulls, MD [id number], [name], MD  Brief Narrative: Patient is a 75 y.o. male who was transferred from [facility] for evaluation of hemoptysis. CT chest on 917 done at [facility] showed patchy airspace disease in the apex of the right lung. Patient was admitted and started on broad-spectrum antibiotics, pulmonology was consulted and a bronchoscopy was performed on 9/19. Awaiting BAL culture and cytology.  Subjective: No further hemoptysis for the past 2 days.  Assessment/Plan: Principal Problem:  Hemoptysis: Seems to have resolved with supportive measures. Unclear etiology, suspect could be from pneumonia. Nonsmoker, no obvious malignancy seen on CT chest. Underwent bronchoscopy on 9/19-await BAL cultures and cytology. Continue empiric antibiotics .Remain in isolation until BAL AFB smear is negative.  Active Problems: Probable community-acquired pneumonia: Continue empiric antibiotics, follow cultures-negative so far. He is afebrile, no leukocytosis and clinically improved with no further hemoptysis.  Stroke: Nonfocal exam, resume aspirin when able   Essential hypertension: Controlled, resume lisinopril  DVT Prophylaxis: SCD's  Code Status: Full code  Family Communication: None at bedside  Disposition Plan: Remain inpatient-suspect home on 9/21  Antimicrobial agents: See below  Procedures: 9/19 >> bronchoscopy   CONSULTS:  pulmonary/intensive care  Time spent: 25 minutes-Greater than 50% of this time was spent in counseling, explanation of diagnosis, planning of further management, and coordination of care.  MEDICATIONS: Anti-infectives    Start     Dose/Rate Route Frequency Ordered Stop   04/17/16 1000  azithromycin (ZITHROMAX) tablet 500 mg     500 mg Oral Daily  04/16/16 0904 04/21/16 0959   04/14/16 0630  cefTRIAXone (ROCEPHIN) 1 g in dextrose 5 % 50 mL IVPB     1 g 100 mL/hr over 30 Minutes Intravenous Every 24 hours 04/14/16 0538 04/21/16 0629   04/14/16 0630  azithromycin (ZITHROMAX) 500 mg in dextrose 5 % 250 mL IVPB  Status:  Discontinued     500 mg 250 mL/hr over 60 Minutes Intravenous Every 24 hours 04/14/16 0538 04/16/16 0904      Scheduled Meds: . [START ON 04/17/2016] azithromycin  500 mg Oral Daily  . cefTRIAXone (ROCEPHIN)  IV  1 g Intravenous Q24H  . Melatonin  3 tablet Oral QHS   Continuous Infusions: . sodium chloride 10 mL/hr at 04/16/16 0300   PRN Meds:.acetaminophen **OR** [DISCONTINUED] acetaminophen, hydrALAZINE, ondansetron **OR** ondansetron (ZOFRAN) IV   PHYSICAL EXAM: Vital signs: Vitals:   04/16/16 0245 04/16/16 0700 04/16/16 1118 04/16/16 1452  BP: 114/81 115/80 (!) 141/87 126/71  Pulse: 99 84 90 90  Resp: (!) 27 (!) 22 13 (!) 22  Temp: 99.1 F (37.3 C) 97.4 F (36.3 C) 97.7 F (36.5 C) 97.7 F (36.5 C)  TempSrc: Oral Oral Oral Oral  SpO2: 92% 96% 96% 97%  Weight:      Height:       Filed Weights   04/14/16 0415 04/14/16 0433 04/15/16 1339  Weight: 77.3 kg (170 lb 6.7 oz) 77.3 kg (170 lb 6.7 oz) 77.1 kg (170 lb)   Body mass index is 24.39 kg/m.   General appearance :Awake, alert, not in any distress. Speech Clear. Not toxic Looking Eyes:, pupils equally reactive to light and accomodation,no scleral icterus.Pink  conjunctiva HEENT: Atraumatic and Normocephalic Neck: supple, no JVD. No cervical lymphadenopathy. No thyromegaly Resp:Good air entry bilaterally, no added sounds  CVS: S1 S2 regular, no murmurs.  GI: Bowel sounds present, Non tender and not distended with no gaurding, rigidity or rebound.No organomegaly Extremities: B/L Lower Ext shows no edema, both legs are warm to touch Neurology:  speech clear,Non focal, sensation is grossly intact. Psychiatric: Normal judgment and insight. Alert and  oriented x 3. Normal mood. Musculoskeletal:gait appears to be normal.No digital cyanosis Skin:No Rash, warm and dry Wounds:N/A  I have personally reviewed following labs and imaging studies  LABORATORY DATA: CBC:  Recent Labs Lab 04/14/16 0752 04/15/16 0439  WBC 8.8 7.1  NEUTROABS 6.3  --   HGB 15.2 13.9  HCT 45.6 43.2  MCV 94.2 94.7  PLT 246 [id number]    Basic Metabolic Panel:  Recent Labs Lab 04/14/16 0752 04/15/16 0439  NA 141 137  K 4.2 3.9  CL 105 106  CO2 26 23  GLUCOSE 103* 132*  BUN 9 11  CREATININE 1.09 1.07  CALCIUM 9.4 8.9    GFR: Estimated Creatinine Clearance: 60.6 mL/min (by C-G formula based on SCr of 1.07 mg/dL).  Liver Function Tests:  Recent Labs Lab 04/14/16 0752  AST 19  ALT 18  ALKPHOS 76  BILITOT 0.8  PROT 7.1  ALBUMIN 3.9   No results for input(s): LIPASE, AMYLASE in the last 168 hours. No results for input(s): AMMONIA in the last 168 hours.  Coagulation Profile:  Recent Labs Lab 04/14/16 0752  INR 1.16    Cardiac Enzymes:  Recent Labs Lab 04/14/16 0752  TROPONINI <0.03    BNP (last 3 results) No results for input(s): PROBNP in the last 8760 hours.  HbA1C:  Recent Labs  04/14/16 0752  HGBA1C 5.7*    CBG: No results for input(s): GLUCAP in the last 168 hours.  Lipid Profile: No results for input(s): CHOL, HDL, LDLCALC, TRIG, CHOLHDL, LDLDIRECT in the last 72 hours.  Thyroid Function Tests: No results for input(s): TSH, T4TOTAL, FREET4, T3FREE, THYROIDAB in the last 72 hours.  Anemia Panel: No results for input(s): VITAMINB12, FOLATE, FERRITIN, TIBC, IRON, RETICCTPCT in the last 72 hours.  Urine analysis:    Component Value Date/Time   COLORURINE YELLOW 04/14/2016 [place] 04/14/2016 0537   LABSPEC 1.018 04/14/2016 0537   PHURINE 7.5 04/14/2016 0537   GLUCOSEU NEGATIVE 04/14/2016 0537   HGBUR NEGATIVE 04/14/2016 0537   BILIRUBINUR NEGATIVE 04/14/2016 0537   KETONESUR NEGATIVE  04/14/2016 0537   PROTEINUR NEGATIVE 04/14/2016 0537   UROBILINOGEN 0.2 11/15/2011 1652   NITRITE NEGATIVE 04/14/2016 0537   LEUKOCYTESUR NEGATIVE 04/14/2016 0537    Sepsis Labs: Lactic Acid, Venous No results found for: LATICACIDVEN  MICROBIOLOGY: Recent Results (from the past 240 hour(s))  MRSA PCR Screening     Status: None   Collection Time: 04/14/16  5:03 AM  Result Value Ref Range Status   MRSA by PCR NEGATIVE NEGATIVE Final    Comment:        The GeneXpert MRSA Assay (FDA approved for NASAL specimens only), is one component of a comprehensive MRSA colonization surveillance program. It is not intended to diagnose MRSA infection nor to guide or monitor treatment for MRSA infections.   Culture, blood (routine x 2) Call MD if unable to obtain prior to antibiotics being given     Status: None (Preliminary result)   Collection Time: 04/14/16  7:35 AM  Result Value Ref  Range Status   Specimen Description BLOOD RIGHT ARM  Final   Special Requests BOTTLES DRAWN AEROBIC AND ANAEROBIC 10CC  Final   Culture NO GROWTH 2 DAYS  Final   Report Status PENDING  Incomplete  Culture, blood (routine x 2) Call MD if unable to obtain prior to antibiotics being given     Status: None (Preliminary result)   Collection Time: 04/14/16  7:50 AM  Result Value Ref Range Status   Specimen Description BLOOD RIGHT FOREARM  Final   Special Requests BOTTLES DRAWN AEROBIC AND ANAEROBIC 5CC  Final   Culture NO GROWTH 2 DAYS  Final   Report Status PENDING  Incomplete  Culture, sputum-assessment     Status: None   Collection Time: 04/14/16  4:06 PM  Result Value Ref Range Status   Specimen Description SPUTUM  Final   Special Requests NONE  Final   Sputum evaluation   Final    THIS SPECIMEN IS ACCEPTABLE. RESPIRATORY CULTURE REPORT TO FOLLOW.   Report Status 04/14/2016 FINAL  Final  Culture, respiratory (NON-Expectorated)     Status: None   Collection Time: 04/14/16  4:06 PM  Result Value Ref  Range Status   Specimen Description EXPECTORATED SPUTUM  Final   Special Requests NONE  Final   Gram Stain   Final    FEW WBC PRESENT,BOTH PMN AND MONONUCLEAR MODERATE GRAM POSITIVE COCCI IN PAIRS MODERATE GRAM VARIABLE ROD FEW SQUAMOUS EPITHELIAL CELLS PRESENT    Culture Consistent with normal respiratory flora.  Final   Report Status 04/16/2016 FINAL  Final  Culture, bal-quantitative     Status: None (Preliminary result)   Collection Time: 04/15/16  2:38 PM  Result Value Ref Range Status   Specimen Description BRONCHIAL ALVEOLAR LAVAGE  Final   Special Requests PRE SAMPLE  Final   Gram Stain   Final    RARE WBC PRESENT, PREDOMINANTLY PMN FEW GRAM POSITIVE COCCI IN CLUSTERS RARE GRAM NEGATIVE RODS    Culture CULTURE REINCUBATED FOR BETTER GROWTH  Final   Report Status PENDING  Incomplete    RADIOLOGY STUDIES/RESULTS: Dg Chest Port 1 View  Result Date: 04/15/2016 CLINICAL DATA:  75 year old male status post bronchoscopy. EXAM: PORTABLE CHEST 1 VIEW COMPARISON:  Chest CTA 04/13/2016 and earlier. FINDINGS: Portable AP semi upright view at 1551 hours. Subtle patchy peripheral right upper lung opacity, might reflect lavage from recent bronchoscopy. No superimposed pneumothorax, pleural effusion, pulmonary edema or other confluent opacity. Mediastinal contours remain normal. Visualized tracheal air column is within normal limits. IMPRESSION: Mild nonspecific patchy right mid lung opacity which might reflect bronchoalveolar lavage in this setting. Otherwise stable chest. Electronically Signed   By: Genevie Ann M.D.   On: 04/15/2016 16:08     LOS: 2 days   Oren Binet, MD  [facility] Pager:[phone]  If 7PM-7AM, please contact night-coverage [email or web address] Password [facility] 04/16/2016, 5:31 PM

## 2016-04-16 NOTE — Progress Notes (Signed)
PHARMACIST - PHYSICIAN COMMUNICATION  CONCERNING: Antibiotic IV to Oral Route Change Policy  RECOMMENDATION: This patient is receiving azithromycin by the intravenous route.  Based on criteria approved by the Pharmacy and Therapeutics Committee, the antibiotic(s) is/are being converted to the equivalent oral dose form(s).   DESCRIPTION: These criteria include:  Patient being treated for a respiratory tract infection, urinary tract infection, cellulitis or clostridium difficile associated diarrhea if on metronidazole  The patient is not neutropenic and does not exhibit a GI malabsorption state  The patient is eating (either orally or via tube) and/or has been taking other orally administered medications for a least 24 hours  The patient is improving clinically and has a Tmax < 100.5  If you have questions about this conversion, please contact the Pharmacy Department  []   [phone] )  Johnathan Reynolds []   [phone] )  [facility] [x]   [phone] )  Johnathan Reynolds []   [phone] )  [facility] []   [phone] )  [place], [place] D 04/16/2016 9:05 AM

## 2016-04-16 NOTE — Progress Notes (Signed)
   Name: Johnathan Reynolds MRN: [id number] DOB: [birth year]    ADMISSION DATE:  04/14/2016 CONSULTATION DATE:  04/14/16  REFERRING MD :  Dr. Thereasa Solo / [facility]   CHIEF COMPLAINT:  Hemoptysis   SUBJECTIVE:   VITAL SIGNS: Temp:  [97.4 F (36.3 C)-99.1 F (37.3 C)] 97.4 F (36.3 C) (09/20 0700) Pulse Rate:  [77-109] 84 (09/20 0700) Resp:  [11-70] 22 (09/20 0700) BP: (104-191)/(62-93) 115/80 (09/20 0700) SpO2:  [92 %-99 %] 96 % (09/20 0700) Weight:  [170 lb (77.1 kg)] 170 lb (77.1 kg) (09/19 1339) Room air  PHYSICAL EXAMINATION: General:  Well developed adult male in NAD  Neuro:  AAOx4, speech clear, MAE  HEENT:  MM pink/moist, no jvd, no LAN  Cardiovascular:  s1s2 rrr, no m/r/g, SR on monitor  Lungs:  Even/non-labored, lungs bilaterally clear no accessory use  Abdomen:  Soft, non-tender, bsx4 active  Musculoskeletal:  No acute deformities  Skin:  Warm/dry, no edema    Recent Labs Lab 04/14/16 0752 04/15/16 0439  NA 141 137  K 4.2 3.9  CL 105 106  CO2 26 23  BUN 9 11  CREATININE 1.09 1.07  GLUCOSE 103* 132*    Recent Labs Lab 04/14/16 0752 04/15/16 0439  HGB 15.2 13.9  HCT 45.6 43.2  WBC 8.8 7.1  PLT 246 224   Dg Chest Port 1 View  Result Date: 04/15/2016 CLINICAL DATA:  75 year old male status post bronchoscopy. EXAM: PORTABLE CHEST 1 VIEW COMPARISON:  Chest CTA 04/13/2016 and earlier. FINDINGS: Portable AP semi upright view at 1551 hours. Subtle patchy peripheral right upper lung opacity, might reflect lavage from recent bronchoscopy. No superimposed pneumothorax, pleural effusion, pulmonary edema or other confluent opacity. Mediastinal contours remain normal. Visualized tracheal air column is within normal limits. IMPRESSION: Mild nonspecific patchy right mid lung opacity which might reflect bronchoalveolar lavage in this setting. Otherwise stable chest. Electronically Signed   By: Genevie Ann M.D.   On: 04/15/2016 16:08     SIGNIFICANT EVENTS  9/18  Admit with  hemopytis  STUDIES:  CTA Chest 9/17 [facility]) >> patchy airspace process within the RUL/apex (infection vs hemorrhage), opacification within the RUL bronchus may be due to hemorrhage vs aspiration, no effusion, mild ectasia of the ascending thoracic aorta measuring 3.4 cm (annual follow up recommended), fluid within the mid to distal esophagus may be due to dysmotility vs reflux, sub cm hypodensity over the R lobe of the liver likely a small cyst or hemangioma but too small to characterize    ASSESSMENT / PLAN:   Hemoptysis - unclear etiology, RUL/apex opacity, soft hazy area throughout all R lung.  Never smoker.  No viral prodrome / URI with provoking cough, no hx of autoimmune disorders, no recent epistaxis or aspiration, no hx of TB or PNA.  No recent instrumentation.   -->she is s/p FOB on 9/20  Plan: HOLD home ASA  Follow sputum culture Trend CBC F/u  quantiferon gold F/u afb  Continue Airborne Precautions for now  Erick Colace ACNP-BC [place] Pager # [phone] OR # [phone] if no answer   Awaiting AFB smear results.  F/u BAL culture and cytology.  If stable over next 24 hrs, then likely can d/c home with outpt follow up.  Chesley Mires, MD [facility] Pulmonary/Critical Care 04/16/2016, 12:23 PM Pager:  [phone] After 3pm call: [phone]

## 2016-04-17 DIAGNOSIS — Z9889 Other specified postprocedural states: Secondary | ICD-10-CM

## 2016-04-17 LAB — HEMOGLOBIN A1C
Hgb A1c MFr Bld: 5.6 % (ref 4.8–5.6)
Mean Plasma Glucose: 114 mg/dL

## 2016-04-17 LAB — QUANTIFERON IN TUBE
QFT TB AG MINUS NIL VALUE: <0 IU/mL
QUANTIFERON MITOGEN VALUE: 9.48 [IU]/mL
QUANTIFERON NIL VALUE: 0.19 [IU]/mL
QUANTIFERON TB AG VALUE: 0.13 IU/mL
QUANTIFERON TB GOLD: NEGATIVE

## 2016-04-17 LAB — CULTURE, BAL-QUANTITATIVE

## 2016-04-17 LAB — QUANTIFERON TB GOLD ASSAY (BLOOD)

## 2016-04-17 LAB — CULTURE, BAL-QUANTITATIVE W GRAM STAIN

## 2016-04-17 MED ORDER — LEVOFLOXACIN 750 MG PO TABS: 750.0000 mg | ORAL_TABLET | Freq: Every day | ORAL | 0 refills | Status: DC

## 2016-04-17 NOTE — Discharge Summary (Signed)
PATIENT DETAILS Name: Johnathan Reynolds Age: 75 y.o. Sex: male Date of Birth: 1940-09-28 MRN: [id number]. Admitting Physician: Vianne Bulls, MD [id number], KEVIN, MD  Admit Date: 04/14/2016 Discharge date: 04/17/2016  Recommendations for Outpatient Follow-up:  1. Follow up with PCP in 1-2 weeks 2. Please obtain BMP/CBC in one week 3. Please follow up on the following pending results: BAL culture and AFB cultures until negative 4. Consider repeat CT chest or chest x-ray in 4-6 weeks  Admitted From:  Home  Disposition: [place]: Yes  Equipment/Devices: None  Discharge Condition: Stable  CODE STATUS: FULL CODE  Diet recommendation:  Heart Healthy  Brief Summary: See H&P, Labs, Consult and Test reports for all details in brief, Patient is a 75 y.o. male who was transferred from [facility] for evaluation of hemoptysis. CT chest on 917 done at [facility] showed patchy airspace disease in the apex of the right lung. Patient was admitted and started on broad-spectrum antibiotics  Brief Hospital Course: Hemoptysis: Seems to have resolved with supportive measures. No hemoptysis for the past 3 days. Unclear etiology, suspect could be from pneumonia. Nonsmoker, no obvious malignancy seen on CT chest. Underwent bronchoscopy on 9/19- BAL AFB smear negative. BAL cytology negative. Awaiting final AFB and BAL cultures. Managed with empiric Rocephin and Zithromax while inpatient, will be transitioned to levofloxacin for a few more days on discharge. Spoke with Dr. Halford Chessman, okay to discharge from his point of view was well, his office will make a follow-up appointment. Recommend that patient get a CT chest or chest x-ray in 4-6 weeks to document resolution of the right upper lobe infiltrate.   Active Problems: Probable community-acquired pneumonia:  managed with empiric antibiotics-being transitioned to levofloxacin. See above.   Stroke: Nonfocal exam, resume aspirin  when able   Essential hypertension: Controlled, resume lisinopril  Procedures/Studies: 9/19 >> bronchoscopy   Discharge Diagnoses:  Principal Problem:   Hemoptysis Active Problems:   Stroke [facility])   Essential hypertension   S/P bronchoscopy   CAP (community acquired pneumonia)   Hyperglycemia   Discharge Instructions:  Activity:  As tolerated with Full fall precautions use walker/cane & assistance as needed   Discharge Instructions    Call MD for:    Complete by:  As directed    Cough up blood   Diet - low sodium heart healthy    Complete by:  As directed    Discharge instructions    Complete by:  As directed    You had Pneumonia: Please ask your primary care practitioner to get a 2 view Chest X ray done in 4-6 weeks after hospital discharge     Follow with Primary MD  Rory Percy, MD  and other consultant's as instructed your Hospitalist MD  Please get a complete blood count and chemistry panel checked by your Primary MD at your next visit, and again as instructed by your Primary MD.  Get Medicines reviewed and adjusted: Please take all your medications with you for your next visit with your Primary MD  Laboratory/radiological data: Please request your Primary MD to go over all hospital tests and procedure/radiological results at the follow up, please ask your Primary MD to get all Hospital records sent to his/her office.  In some cases, they will be blood work, cultures and biopsy results pending at the time of your discharge. Please request that your primary care M.D. follows up on these results.  Also Note the following: If you experience  worsening of your admission symptoms, develop shortness of breath, life threatening emergency, suicidal or homicidal thoughts you must seek medical attention immediately by calling 911 or calling your MD immediately  if symptoms less severe.  You must read complete instructions/literature along with all the possible adverse  reactions/side effects for all the Medicines you take and that have been prescribed to you. Take any new Medicines after you have completely understood and accpet all the possible adverse reactions/side effects.   Do not drive when taking Pain medications or sleeping medications (Benzodaizepines)  Do not take more than prescribed Pain, Sleep and Anxiety Medications. It is not advisable to combine anxiety,sleep and pain medications without talking with your primary care practitioner  Special Instructions: If you have smoked or chewed Tobacco  in the last 2 yrs please stop smoking, stop any regular Alcohol  and or any Recreational drug use.  Wear Seat belts while driving.  Please note: You were cared for by a hospitalist during your hospital stay. Once you are discharged, your primary care physician will handle any further medical issues. Please note that NO REFILLS for any discharge medications will be authorized once you are discharged, as it is imperative that you return to your primary care physician (or establish a relationship with a primary care physician if you do not have one) for your post hospital discharge needs so that they can reassess your need for medications and monitor your lab values.   Increase activity slowly    Complete by:  As directed        Medication List    TAKE these medications   acetaminophen 650 MG CR tablet Commonly known as:  TYLENOL Take 1,300 mg by mouth every 8 (eight) hours as needed for pain.   Calcium Carb-Cholecalciferol 600-800 MG-UNIT Tabs Take 1 tablet by mouth daily.   gabapentin 300 MG capsule Commonly known as:  NEURONTIN Take 300 mg by mouth in the morning and take 600 mg by mouth in the evening   levofloxacin 750 MG tablet Commonly known as:  LEVAQUIN Take 1 tablet (750 mg total) by mouth daily.   lisinopril 5 MG tablet Commonly known as:  PRINIVIL,ZESTRIL Take 5 mg by mouth daily.   Melatonin 10 MG Tabs Take 1 tablet by mouth at  bedtime.   multivitamin with minerals Tabs tablet Take 1 tablet by mouth daily.      Follow-up Information    Rory Percy, MD. Schedule an appointment as soon as possible for a visit in 2 week(s).   Specialty:  Family Medicine Contact information: [place] [address] [phone]        Chesley Mires, MD .   Specialty:  Pulmonary Disease Why:  Office will call you for a follow-up appointment Contact information: [address] [address] [phone]          No Known Allergies  Consultations:   pulmonary/intensive care  Other Procedures/Studies: Dg Chest Port 1 View  Result Date: 04/15/2016 CLINICAL DATA:  75 year old male status post bronchoscopy. EXAM: PORTABLE CHEST 1 VIEW COMPARISON:  Chest CTA 04/13/2016 and earlier. FINDINGS: Portable AP semi upright view at 1551 hours. Subtle patchy peripheral right upper lung opacity, might reflect lavage from recent bronchoscopy. No superimposed pneumothorax, pleural effusion, pulmonary edema or other confluent opacity. Mediastinal contours remain normal. Visualized tracheal air column is within normal limits. IMPRESSION: Mild nonspecific patchy right mid lung opacity which might reflect bronchoalveolar lavage in this setting. Otherwise stable chest. Electronically  Signed   By: Genevie Ann M.D.   On: 04/15/2016 16:08      TODAY-DAY OF DISCHARGE:  Subjective:   Johnathan Reynolds today has no headache,no chest abdominal pain,no new weakness tingling or numbness, feels much better wants to go home today.   Objective:   Blood pressure 117/72, pulse 73, temperature 97.5 F (36.4 C), temperature source Oral, resp. rate 20, height 5\' 10"  (1.778 m), weight 77.1 kg (170 lb), SpO2 95 %.  Intake/Output Summary (Last 24 hours) at 04/17/16 1015 Last data filed at 04/17/16 0305  Gross per 24 hour  Intake              600 ml  Output                0 ml  Net              600 ml   Filed Weights   04/14/16 0415  04/14/16 0433 04/15/16 1339  Weight: 77.3 kg (170 lb 6.7 oz) 77.3 kg (170 lb 6.7 oz) 77.1 kg (170 lb)    Exam: Awake Alert, Oriented *3, No new F.N deficits, Normal affect [place].AT,PERRAL Supple Neck,No JVD, No cervical lymphadenopathy appriciated.  Symmetrical Chest wall movement, Good air movement bilaterally, CTAB RRR,No Gallops,Rubs or new Murmurs, No Parasternal Heave +ve B.Sounds, Abd Soft, Non tender, No organomegaly appriciated, No rebound -guarding or rigidity. No Cyanosis, Clubbing or edema, No new Rash or bruise   PERTINENT RADIOLOGIC STUDIES: Dg Chest Port 1 View  Result Date: 04/15/2016 CLINICAL DATA:  75 year old male status post bronchoscopy. EXAM: PORTABLE CHEST 1 VIEW COMPARISON:  Chest CTA 04/13/2016 and earlier. FINDINGS: Portable AP semi upright view at 1551 hours. Subtle patchy peripheral right upper lung opacity, might reflect lavage from recent bronchoscopy. No superimposed pneumothorax, pleural effusion, pulmonary edema or other confluent opacity. Mediastinal contours remain normal. Visualized tracheal air column is within normal limits. IMPRESSION: Mild nonspecific patchy right mid lung opacity which might reflect bronchoalveolar lavage in this setting. Otherwise stable chest. Electronically Signed   By: Genevie Ann M.D.   On: 04/15/2016 16:08     PERTINENT LAB RESULTS: CBC:  Recent Labs  04/15/16 0439  WBC 7.1  HGB 13.9  HCT 43.2  PLT 224   CMET CMP     Component Value Date/Time   NA 137 04/15/2016 0439   K 3.9 04/15/2016 0439   CL 106 04/15/2016 0439   CO2 23 04/15/2016 0439   GLUCOSE 132 (H) 04/15/2016 0439   BUN 11 04/15/2016 0439   CREATININE 1.07 04/15/2016 0439   CALCIUM 8.9 04/15/2016 0439   PROT 7.1 04/14/2016 0752   ALBUMIN 3.9 04/14/2016 0752   AST 19 04/14/2016 0752   ALT 18 04/14/2016 0752   ALKPHOS 76 04/14/2016 0752   BILITOT 0.8 04/14/2016 0752   GFRNONAA >60 04/15/2016 0439   GFRAA >60 04/15/2016 0439    GFR Estimated  Creatinine Clearance: 60.6 mL/min (by C-G formula based on SCr of 1.07 mg/dL). No results for input(s): LIPASE, AMYLASE in the last 72 hours. No results for input(s): CKTOTAL, CKMB, CKMBINDEX, TROPONINI in the last 72 hours. Invalid input(s): POCBNP No results for input(s): DDIMER in the last 72 hours.  Recent Labs  04/16/16 0430  HGBA1C 5.6   No results for input(s): CHOL, HDL, LDLCALC, TRIG, CHOLHDL, LDLDIRECT in the last 72 hours. No results for input(s): TSH, T4TOTAL, T3FREE, THYROIDAB in the last 72 hours.  Invalid input(s): FREET3 No results for input(s): VITAMINB12, FOLATE,  FERRITIN, TIBC, IRON, RETICCTPCT in the last 72 hours. Coags: No results for input(s): INR in the last 72 hours.  Invalid input(s): PT Microbiology: Recent Results (from the past 240 hour(s))  MRSA PCR Screening     Status: None   Collection Time: 04/14/16  5:03 AM  Result Value Ref Range Status   MRSA by PCR NEGATIVE NEGATIVE Final    Comment:        The GeneXpert MRSA Assay (FDA approved for NASAL specimens only), is one component of a comprehensive MRSA colonization surveillance program. It is not intended to diagnose MRSA infection nor to guide or monitor treatment for MRSA infections.   Culture, blood (routine x 2) Call MD if unable to obtain prior to antibiotics being given     Status: None (Preliminary result)   Collection Time: 04/14/16  7:35 AM  Result Value Ref Range Status   Specimen Description BLOOD RIGHT ARM  Final   Special Requests BOTTLES DRAWN AEROBIC AND ANAEROBIC 10CC  Final   Culture NO GROWTH 2 DAYS  Final   Report Status PENDING  Incomplete  Culture, blood (routine x 2) Call MD if unable to obtain prior to antibiotics being given     Status: None (Preliminary result)   Collection Time: 04/14/16  7:50 AM  Result Value Ref Range Status   Specimen Description BLOOD RIGHT FOREARM  Final   Special Requests BOTTLES DRAWN AEROBIC AND ANAEROBIC 5CC  Final   Culture NO GROWTH 2  DAYS  Final   Report Status PENDING  Incomplete  Culture, sputum-assessment     Status: None   Collection Time: 04/14/16  4:06 PM  Result Value Ref Range Status   Specimen Description SPUTUM  Final   Special Requests NONE  Final   Sputum evaluation   Final    THIS SPECIMEN IS ACCEPTABLE. RESPIRATORY CULTURE REPORT TO FOLLOW.   Report Status 04/14/2016 FINAL  Final  Culture, respiratory (NON-Expectorated)     Status: None   Collection Time: 04/14/16  4:06 PM  Result Value Ref Range Status   Specimen Description EXPECTORATED SPUTUM  Final   Special Requests NONE  Final   Gram Stain   Final    FEW WBC PRESENT,BOTH PMN AND MONONUCLEAR MODERATE GRAM POSITIVE COCCI IN PAIRS MODERATE GRAM VARIABLE ROD FEW SQUAMOUS EPITHELIAL CELLS PRESENT    Culture Consistent with normal respiratory flora.  Final   Report Status 04/16/2016 FINAL  Final  Culture, bal-quantitative     Status: None (Preliminary result)   Collection Time: 04/15/16  2:38 PM  Result Value Ref Range Status   Specimen Description BRONCHIAL ALVEOLAR LAVAGE  Final   Special Requests PRE SAMPLE  Final   Gram Stain   Final    RARE WBC PRESENT, PREDOMINANTLY PMN FEW GRAM POSITIVE COCCI IN CLUSTERS RARE GRAM NEGATIVE RODS    Culture CULTURE REINCUBATED FOR BETTER GROWTH  Final   Report Status PENDING  Incomplete  Acid Fast Smear (AFB)     Status: None   Collection Time: 04/15/16  2:38 PM  Result Value Ref Range Status   AFB Specimen Processing Concentration  Final   Acid Fast Smear Negative  Final    Comment: (NOTE) Performed At: [facility] [place], [place] [id number] Lindon Romp MD [id number]    Source (AFB) BRONCHIAL ALVEOLAR LAVAGE  Final    FURTHER DISCHARGE INSTRUCTIONS:  Get Medicines reviewed and adjusted: Please take all your medications with you for your next visit with  your Primary MD  Laboratory/radiological data: Please request your Primary MD to go over all hospital  tests and procedure/radiological results at the follow up, please ask your Primary MD to get all Hospital records sent to his/her office.  In some cases, they will be blood work, cultures and biopsy results pending at the time of your discharge. Please request that your primary care M.D. goes through all the records of your hospital data and follows up on these results.  Also Note the following: If you experience worsening of your admission symptoms, develop shortness of breath, life threatening emergency, suicidal or homicidal thoughts you must seek medical attention immediately by calling 911 or calling your MD immediately  if symptoms less severe.  You must read complete instructions/literature along with all the possible adverse reactions/side effects for all the Medicines you take and that have been prescribed to you. Take any new Medicines after you have completely understood and accpet all the possible adverse reactions/side effects.   Do not drive when taking Pain medications or sleeping medications (Benzodaizepines)  Do not take more than prescribed Pain, Sleep and Anxiety Medications. It is not advisable to combine anxiety,sleep and pain medications without talking with your primary care practitioner  Special Instructions: If you have smoked or chewed Tobacco  in the last 2 yrs please stop smoking, stop any regular Alcohol  and or any Recreational drug use.  Wear Seat belts while driving.  Please note: You were cared for by a hospitalist during your hospital stay. Once you are discharged, your primary care physician will handle any further medical issues. Please note that NO REFILLS for any discharge medications will be authorized once you are discharged, as it is imperative that you return to your primary care physician (or establish a relationship with a primary care physician if you do not have one) for your post hospital discharge needs so that they can reassess your need for  medications and monitor your lab values.  Total Time spent coordinating discharge including counseling, education and face to face time equals  45 minutes  Signed: Oren Binet 04/17/2016 10:15 AM

## 2016-04-17 NOTE — Care Management Important Message (Signed)
Important Message  Patient Details  Name: Johnathan Reynolds MRN: [id number] Date of Birth: Mar 12, 1941   Medicare Important Message Given:  Yes    Zenon Mayo, RN 04/17/2016, 12:21 [place] Message  Patient Details  Name: Johnathan Reynolds MRN: [id number] Date of Birth: 08-20-1940   Medicare Important Message Given:  Yes    Zenon Mayo, RN 04/17/2016, 12:21 PM

## 2016-04-17 NOTE — Care Management Note (Signed)
Case Management Note  Patient Details  Name: PAVLE HARIG MRN: [id number] Date of Birth: 03-23-1941  Subjective/Objective:   Patient is for dc today, his quanterferon is negative, he is taken off airborne precautions. He has transport at dc, he has medication coverage and a  PCP.  No needs.                Action/Plan:   Expected Discharge Date:  04/18/16               Expected Discharge Plan:  Home/Self Care  In-House Referral:     Discharge planning Services  CM Consult  Post Acute Care Choice:    Choice offered to:     DME Arranged:    DME Agency:     HH Arranged:    HH Agency:     Status of Service:  Completed, signed off  If discussed at [employer] of Stay Meetings, dates discussed:    Additional Comments:  Zenon Mayo, RN 04/17/2016, 11:00 AM

## 2016-04-17 NOTE — Progress Notes (Signed)
Pt discharged per MD order, all discharge instructions reviewed and all questions answered.  

## 2016-04-19 LAB — CULTURE, BLOOD (ROUTINE X 2)
Culture: NO GROWTH
Culture: NO GROWTH

## 2016-04-23 ENCOUNTER — Inpatient Hospital Stay: Payer: Medicare HMO | Admitting: Pulmonary Disease

## 2016-04-28 DIAGNOSIS — R042 Hemoptysis: Secondary | ICD-10-CM | POA: Diagnosis not present

## 2016-04-28 DIAGNOSIS — Z6824 Body mass index (BMI) 24.0-24.9, adult: Secondary | ICD-10-CM | POA: Diagnosis not present

## 2016-05-12 DIAGNOSIS — I700 Atherosclerosis of aorta: Secondary | ICD-10-CM | POA: Diagnosis not present

## 2016-05-12 DIAGNOSIS — R042 Hemoptysis: Secondary | ICD-10-CM | POA: Diagnosis not present

## 2016-05-12 DIAGNOSIS — J189 Pneumonia, unspecified organism: Secondary | ICD-10-CM | POA: Diagnosis not present

## 2016-05-29 LAB — ACID FAST CULTURE WITH REFLEXED SENSITIVITIES

## 2016-05-29 LAB — ACID FAST CULTURE WITH REFLEXED SENSITIVITIES (MYCOBACTERIA): Acid Fast Culture: NEGATIVE

## 2016-06-24 DIAGNOSIS — I10 Essential (primary) hypertension: Secondary | ICD-10-CM | POA: Diagnosis not present

## 2016-06-24 DIAGNOSIS — Z0100 Encounter for examination of eyes and vision without abnormal findings: Secondary | ICD-10-CM | POA: Diagnosis not present

## 2016-12-11 DIAGNOSIS — E7800 Pure hypercholesterolemia, unspecified: Secondary | ICD-10-CM | POA: Diagnosis not present

## 2016-12-11 DIAGNOSIS — I10 Essential (primary) hypertension: Secondary | ICD-10-CM | POA: Diagnosis not present

## 2016-12-15 DIAGNOSIS — Z6823 Body mass index (BMI) 23.0-23.9, adult: Secondary | ICD-10-CM | POA: Diagnosis not present

## 2016-12-15 DIAGNOSIS — Z1389 Encounter for screening for other disorder: Secondary | ICD-10-CM | POA: Diagnosis not present

## 2016-12-15 DIAGNOSIS — E7800 Pure hypercholesterolemia, unspecified: Secondary | ICD-10-CM | POA: Diagnosis not present

## 2016-12-15 DIAGNOSIS — I639 Cerebral infarction, unspecified: Secondary | ICD-10-CM | POA: Diagnosis not present

## 2016-12-15 DIAGNOSIS — S065X9A Traumatic subdural hemorrhage with loss of consciousness of unspecified duration, initial encounter: Secondary | ICD-10-CM | POA: Diagnosis not present

## 2016-12-15 DIAGNOSIS — G4700 Insomnia, unspecified: Secondary | ICD-10-CM | POA: Diagnosis not present

## 2016-12-15 DIAGNOSIS — I10 Essential (primary) hypertension: Secondary | ICD-10-CM | POA: Diagnosis not present

## 2016-12-15 DIAGNOSIS — Z0001 Encounter for general adult medical examination with abnormal findings: Secondary | ICD-10-CM | POA: Diagnosis not present

## 2017-06-16 DIAGNOSIS — M1990 Unspecified osteoarthritis, unspecified site: Secondary | ICD-10-CM | POA: Diagnosis not present

## 2017-06-16 DIAGNOSIS — K625 Hemorrhage of anus and rectum: Secondary | ICD-10-CM | POA: Diagnosis not present

## 2017-06-16 DIAGNOSIS — I10 Essential (primary) hypertension: Secondary | ICD-10-CM | POA: Diagnosis not present

## 2017-06-16 DIAGNOSIS — S065X9A Traumatic subdural hemorrhage with loss of consciousness of unspecified duration, initial encounter: Secondary | ICD-10-CM | POA: Diagnosis not present

## 2017-06-16 DIAGNOSIS — E7800 Pure hypercholesterolemia, unspecified: Secondary | ICD-10-CM | POA: Diagnosis not present

## 2017-06-16 DIAGNOSIS — I639 Cerebral infarction, unspecified: Secondary | ICD-10-CM | POA: Diagnosis not present

## 2017-06-19 DIAGNOSIS — S065X9A Traumatic subdural hemorrhage with loss of consciousness of unspecified duration, initial encounter: Secondary | ICD-10-CM | POA: Diagnosis not present

## 2017-06-19 DIAGNOSIS — I10 Essential (primary) hypertension: Secondary | ICD-10-CM | POA: Diagnosis not present

## 2017-06-19 DIAGNOSIS — Z6824 Body mass index (BMI) 24.0-24.9, adult: Secondary | ICD-10-CM | POA: Diagnosis not present

## 2017-06-19 DIAGNOSIS — M545 Low back pain: Secondary | ICD-10-CM | POA: Diagnosis not present

## 2017-06-19 DIAGNOSIS — I639 Cerebral infarction, unspecified: Secondary | ICD-10-CM | POA: Diagnosis not present

## 2017-08-20 DIAGNOSIS — R69 Illness, unspecified: Secondary | ICD-10-CM | POA: Diagnosis not present

## 2017-12-17 DIAGNOSIS — I10 Essential (primary) hypertension: Secondary | ICD-10-CM | POA: Diagnosis not present

## 2017-12-17 DIAGNOSIS — M1990 Unspecified osteoarthritis, unspecified site: Secondary | ICD-10-CM | POA: Diagnosis not present

## 2017-12-17 DIAGNOSIS — R739 Hyperglycemia, unspecified: Secondary | ICD-10-CM | POA: Diagnosis not present

## 2017-12-17 DIAGNOSIS — E7800 Pure hypercholesterolemia, unspecified: Secondary | ICD-10-CM | POA: Diagnosis not present

## 2017-12-17 DIAGNOSIS — G4700 Insomnia, unspecified: Secondary | ICD-10-CM | POA: Diagnosis not present

## 2017-12-22 DIAGNOSIS — Z6824 Body mass index (BMI) 24.0-24.9, adult: Secondary | ICD-10-CM | POA: Diagnosis not present

## 2017-12-22 DIAGNOSIS — G4700 Insomnia, unspecified: Secondary | ICD-10-CM | POA: Diagnosis not present

## 2017-12-22 DIAGNOSIS — I10 Essential (primary) hypertension: Secondary | ICD-10-CM | POA: Diagnosis not present

## 2017-12-22 DIAGNOSIS — I639 Cerebral infarction, unspecified: Secondary | ICD-10-CM | POA: Diagnosis not present

## 2017-12-22 DIAGNOSIS — E7800 Pure hypercholesterolemia, unspecified: Secondary | ICD-10-CM | POA: Diagnosis not present

## 2017-12-22 DIAGNOSIS — Z0001 Encounter for general adult medical examination with abnormal findings: Secondary | ICD-10-CM | POA: Diagnosis not present

## 2017-12-22 DIAGNOSIS — R260 Ataxic gait: Secondary | ICD-10-CM | POA: Diagnosis not present

## 2017-12-22 DIAGNOSIS — R739 Hyperglycemia, unspecified: Secondary | ICD-10-CM | POA: Diagnosis not present

## 2018-04-27 DIAGNOSIS — I10 Essential (primary) hypertension: Secondary | ICD-10-CM | POA: Diagnosis not present

## 2018-04-27 DIAGNOSIS — Z8673 Personal history of transient ischemic attack (TIA), and cerebral infarction without residual deficits: Secondary | ICD-10-CM | POA: Diagnosis not present

## 2018-04-27 DIAGNOSIS — Z6824 Body mass index (BMI) 24.0-24.9, adult: Secondary | ICD-10-CM | POA: Diagnosis not present

## 2018-04-27 DIAGNOSIS — Z8679 Personal history of other diseases of the circulatory system: Secondary | ICD-10-CM | POA: Diagnosis not present

## 2018-04-27 DIAGNOSIS — R42 Dizziness and giddiness: Secondary | ICD-10-CM | POA: Diagnosis not present

## 2018-04-27 DIAGNOSIS — Z0100 Encounter for examination of eyes and vision without abnormal findings: Secondary | ICD-10-CM | POA: Diagnosis not present

## 2018-04-27 DIAGNOSIS — H2510 Age-related nuclear cataract, unspecified eye: Secondary | ICD-10-CM | POA: Diagnosis not present

## 2018-04-27 DIAGNOSIS — D519 Vitamin B12 deficiency anemia, unspecified: Secondary | ICD-10-CM | POA: Diagnosis not present

## 2018-05-03 DIAGNOSIS — J3489 Other specified disorders of nose and nasal sinuses: Secondary | ICD-10-CM | POA: Diagnosis not present

## 2018-05-03 DIAGNOSIS — R51 Headache: Secondary | ICD-10-CM | POA: Diagnosis not present

## 2018-05-03 DIAGNOSIS — G319 Degenerative disease of nervous system, unspecified: Secondary | ICD-10-CM | POA: Diagnosis not present

## 2018-05-03 DIAGNOSIS — R42 Dizziness and giddiness: Secondary | ICD-10-CM | POA: Diagnosis not present

## 2018-05-03 DIAGNOSIS — M6281 Muscle weakness (generalized): Secondary | ICD-10-CM | POA: Diagnosis not present

## 2018-05-03 DIAGNOSIS — I69398 Other sequelae of cerebral infarction: Secondary | ICD-10-CM | POA: Diagnosis not present

## 2018-05-12 DIAGNOSIS — Z1331 Encounter for screening for depression: Secondary | ICD-10-CM | POA: Diagnosis not present

## 2018-05-12 DIAGNOSIS — I10 Essential (primary) hypertension: Secondary | ICD-10-CM | POA: Diagnosis not present

## 2018-05-12 DIAGNOSIS — Z1389 Encounter for screening for other disorder: Secondary | ICD-10-CM | POA: Diagnosis not present

## 2018-05-12 DIAGNOSIS — Z8679 Personal history of other diseases of the circulatory system: Secondary | ICD-10-CM | POA: Diagnosis not present

## 2018-05-12 DIAGNOSIS — R42 Dizziness and giddiness: Secondary | ICD-10-CM | POA: Diagnosis not present

## 2018-05-12 DIAGNOSIS — Z23 Encounter for immunization: Secondary | ICD-10-CM | POA: Diagnosis not present

## 2018-05-12 DIAGNOSIS — R7303 Prediabetes: Secondary | ICD-10-CM | POA: Diagnosis not present

## 2018-05-12 DIAGNOSIS — Z8673 Personal history of transient ischemic attack (TIA), and cerebral infarction without residual deficits: Secondary | ICD-10-CM | POA: Diagnosis not present

## 2019-01-03 DIAGNOSIS — E7800 Pure hypercholesterolemia, unspecified: Secondary | ICD-10-CM | POA: Diagnosis not present

## 2019-01-03 DIAGNOSIS — E559 Vitamin D deficiency, unspecified: Secondary | ICD-10-CM | POA: Diagnosis not present

## 2019-01-03 DIAGNOSIS — I10 Essential (primary) hypertension: Secondary | ICD-10-CM | POA: Diagnosis not present

## 2019-01-03 DIAGNOSIS — R7303 Prediabetes: Secondary | ICD-10-CM | POA: Diagnosis not present

## 2019-01-06 DIAGNOSIS — Z0001 Encounter for general adult medical examination with abnormal findings: Secondary | ICD-10-CM | POA: Diagnosis not present

## 2019-01-06 DIAGNOSIS — E785 Hyperlipidemia, unspecified: Secondary | ICD-10-CM | POA: Diagnosis not present

## 2019-01-06 DIAGNOSIS — R260 Ataxic gait: Secondary | ICD-10-CM | POA: Diagnosis not present

## 2019-01-06 DIAGNOSIS — I10 Essential (primary) hypertension: Secondary | ICD-10-CM | POA: Diagnosis not present

## 2019-01-06 DIAGNOSIS — Z6824 Body mass index (BMI) 24.0-24.9, adult: Secondary | ICD-10-CM | POA: Diagnosis not present

## 2019-04-13 DIAGNOSIS — R69 Illness, unspecified: Secondary | ICD-10-CM | POA: Diagnosis not present

## 2019-05-20 DIAGNOSIS — Z23 Encounter for immunization: Secondary | ICD-10-CM | POA: Diagnosis not present

## 2019-08-26 DIAGNOSIS — I10 Essential (primary) hypertension: Secondary | ICD-10-CM | POA: Diagnosis not present

## 2019-08-26 DIAGNOSIS — E7801 Familial hypercholesterolemia: Secondary | ICD-10-CM | POA: Diagnosis not present

## 2019-08-26 DIAGNOSIS — E7849 Other hyperlipidemia: Secondary | ICD-10-CM | POA: Diagnosis not present

## 2019-08-29 DIAGNOSIS — Z1212 Encounter for screening for malignant neoplasm of rectum: Secondary | ICD-10-CM | POA: Diagnosis not present

## 2019-08-29 DIAGNOSIS — Z1211 Encounter for screening for malignant neoplasm of colon: Secondary | ICD-10-CM | POA: Diagnosis not present

## 2019-08-30 DIAGNOSIS — Z0100 Encounter for examination of eyes and vision without abnormal findings: Secondary | ICD-10-CM | POA: Diagnosis not present

## 2019-08-30 DIAGNOSIS — I10 Essential (primary) hypertension: Secondary | ICD-10-CM | POA: Diagnosis not present

## 2019-08-30 DIAGNOSIS — H5200 Hypermetropia, unspecified eye: Secondary | ICD-10-CM | POA: Diagnosis not present

## 2019-08-30 DIAGNOSIS — E7800 Pure hypercholesterolemia, unspecified: Secondary | ICD-10-CM | POA: Diagnosis not present

## 2019-08-30 DIAGNOSIS — H2513 Age-related nuclear cataract, bilateral: Secondary | ICD-10-CM | POA: Diagnosis not present

## 2019-09-14 DIAGNOSIS — K921 Melena: Secondary | ICD-10-CM | POA: Diagnosis not present

## 2019-09-23 DIAGNOSIS — E7801 Familial hypercholesterolemia: Secondary | ICD-10-CM | POA: Diagnosis not present

## 2019-09-23 DIAGNOSIS — I10 Essential (primary) hypertension: Secondary | ICD-10-CM | POA: Diagnosis not present

## 2019-10-05 DIAGNOSIS — Z01818 Encounter for other preprocedural examination: Secondary | ICD-10-CM | POA: Diagnosis not present

## 2019-10-07 DIAGNOSIS — K640 First degree hemorrhoids: Secondary | ICD-10-CM | POA: Diagnosis not present

## 2019-10-07 DIAGNOSIS — K625 Hemorrhage of anus and rectum: Secondary | ICD-10-CM | POA: Diagnosis not present

## 2019-10-07 DIAGNOSIS — K5730 Diverticulosis of large intestine without perforation or abscess without bleeding: Secondary | ICD-10-CM | POA: Diagnosis not present

## 2019-10-07 DIAGNOSIS — I69351 Hemiplegia and hemiparesis following cerebral infarction affecting right dominant side: Secondary | ICD-10-CM | POA: Diagnosis not present

## 2019-10-07 DIAGNOSIS — I10 Essential (primary) hypertension: Secondary | ICD-10-CM | POA: Diagnosis not present

## 2019-10-07 DIAGNOSIS — I639 Cerebral infarction, unspecified: Secondary | ICD-10-CM | POA: Diagnosis not present

## 2019-10-07 DIAGNOSIS — M1990 Unspecified osteoarthritis, unspecified site: Secondary | ICD-10-CM | POA: Diagnosis not present

## 2019-10-07 DIAGNOSIS — K921 Melena: Secondary | ICD-10-CM | POA: Diagnosis not present

## 2019-10-07 DIAGNOSIS — Z79899 Other long term (current) drug therapy: Secondary | ICD-10-CM | POA: Diagnosis not present

## 2019-10-11 ENCOUNTER — Institutional Professional Consult (permissible substitution): Payer: Medicare HMO | Admitting: Critical Care Medicine

## 2019-10-11 NOTE — Progress Notes (Deleted)
Synopsis: Referred in March 2021 for PFT by [facility].  Subjective:   PATIENT ID: Johnathan Reynolds GENDER: male DOB: [birth year], MRN: [id number]  No chief complaint on file.   HPI  Referral sent from [facility] for PFT for abnormal CT chest, cough, history of tobacco abuse  Bronch for hemoptysis in 2017- [employer] negative, cultures grew atyipcal bug    Past Medical History:  Diagnosis Date  . DDD (degenerative disc disease), cervical   . DJD (degenerative joint disease)   . Hypertension   . Nerve pain   . Stroke ([place]) [id number]   embolic, evaluated at [facility]  . 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 CAD Unknown        grandparents and mother  . CAD Mother   . Diabetes Mellitus II Maternal Grandmother      Past Surgical History:  Procedure Laterality Date  . CRANIOTOMY  11/15/2011   Procedure: CRANIOTOMY HEMATOMA EVACUATION SUBDURAL;  Surgeon: Otilio Connors, MD;  Location: [place] NEURO ORS;  Service: Neurosurgery;  Laterality: N/A;  Bilateral [facility] Drainage for subdural hematoma  . LAMINECTOMY     twice, most recently 1996  . VIDEO BRONCHOSCOPY Bilateral 04/15/2016   Procedure: VIDEO BRONCHOSCOPY WITHOUT FLUORO;  Surgeon: Chesley Mires, MD;  Location[facility] END[facility]COPY;  Service: Cardiopulmonary;  Laterality: Bilateral;    Social History   Socioeconomic History  . Marital status: Single    Spouse name: Not on file  . Number of children: Not on file  . Years of education: Not on file  . Highest education level: Not on file  Occupational History  . Not on file  Tobacco Use  . Smoking status: Never Smoker  . Smokeless tobacco: Never Used  Substance and Sexual Activity  . Alcohol use: Yes    Alcohol/week: 0.0 standard drinks    Comment: rare  . Drug use: No  . Sexual activity: Not Currently  Other Topics Concern  . Not on file    Social History Narrative   Pt lives in [place] alone.  Widowed.  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  No children   Social Determinants of Health   Financial Resource Strain:   . Difficulty of Paying Living Expenses:   Food Insecurity:   . Worried About Charity fundraiser in the Last Year:   . Arboriculturist in the Last Year:   Transportation Needs:   . Film/video editor (Medical):   Marland Kitchen Lack of Transportation (Non-Medical):   Physical Activity:   . Days of Exercise per Week:   . Minutes of Exercise per Session:   Stress:   . Feeling of Stress :   Social Connections:   . Frequency of Communication with Friends and Family:   . Frequency of Social Gatherings with Friends and Family:   . Attends Religious Services:   . Active Member of Clubs or Organizations:   . Attends Archivist Meetings:   Marland Kitchen Marital Status:   Intimate Partner Violence:   . Fear of Current or Ex-Partner:   . Emotionally Abused:   Marland Kitchen Physically Abused:   . Sexually Abused:      No Known Allergies    There is no immunization history on file for this patient.  Outpatient Medications Prior  to Visit  Medication Sig Dispense Refill  . acetaminophen (TYLENOL) 650 MG CR tablet Take 1,300 mg by mouth every 8 (eight) hours as needed for pain.     . Calcium Carb-Cholecalciferol 600-800 MG-UNIT TABS Take 1 tablet by mouth daily.    Marland Kitchen gabapentin (NEURONTIN) 300 MG capsule Take 300 mg by mouth in the morning and take 600 mg by mouth in the evening    . levofloxacin (LEVAQUIN) 750 MG tablet Take 1 tablet (750 mg total) by mouth daily. 2 tablet 0  . lisinopril (PRINIVIL,ZESTRIL) 5 MG tablet Take 5 mg by mouth daily.    . Melatonin 10 MG TABS Take 1 tablet by mouth at bedtime.    . Multiple Vitamin (MULITIVITAMIN WITH MINERALS) TABS Take 1 tablet by mouth daily.     No facility-administered medications prior to visit.    ROS   Objective:  There were no vitals filed  for this visit.   on *** LPM *** RA BMI Readings from Last 3 Encounters:  04/15/16 24.39 kg/m  12/11/14 24.44 kg/m  11/19/11 23.03 kg/m   Wt Readings from Last 3 Encounters:  04/15/16 170 lb (77.1 kg)  12/11/14 172 lb 12.8 oz (78.4 kg)  11/19/11 160 lb 7.9 oz (72.8 kg)    Physical Exam   CBC    Component Value Date/Time   WBC 7.1 04/15/2016 0439   RBC 4.56 04/15/2016 0439   HGB 13.9 04/15/2016 0439   HCT 43.2 04/15/2016 0439   PLT 224 04/15/2016 0439   MCV 94.7 04/15/2016 0439   MCH 30.5 04/15/2016 0439   MCHC 32.2 04/15/2016 0439   RDW 12.9 04/15/2016 0439   LYMPHSABS 1.7 04/14/2016 0752   MONOABS 0.7 04/14/2016 0752   EOSABS 0.1 04/14/2016 0752   BASOSABS 0.0 04/14/2016 0752    CHEMISTRY No results for input(s): NA, K, CL, CO2, GLUCOSE, BUN, CREATININE, CALCIUM, MG, PHOS in the last 168 hours. CrCl cannot be calculated (Patient's most recent lab result is older than the maximum 21 days allowed.).   Chest Imaging- films reviewed: CXR, 1 view 04/15/2016- faint right apical opacity  Pulmonary Functions Testing Results: No flowsheet data found.      Assessment & Plan:   No diagnosis found.    Current Outpatient Medications:  .  acetaminophen (TYLENOL) 650 MG CR tablet, Take 1,300 mg by mouth every 8 (eight) hours as needed for pain. , Disp: , Rfl:  .  Calcium Carb-Cholecalciferol 600-800 MG-UNIT TABS, Take 1 tablet by mouth daily., Disp: , Rfl:  .  gabapentin (NEURONTIN) 300 MG capsule, Take 300 mg by mouth in the morning and take 600 mg by mouth in the evening, Disp: , Rfl:  .  levofloxacin (LEVAQUIN) 750 MG tablet, Take 1 tablet (750 mg total) by mouth daily., Disp: 2 tablet, Rfl: 0 .  lisinopril (PRINIVIL,ZESTRIL) 5 MG tablet, Take 5 mg by mouth daily., Disp: , Rfl:  .  Melatonin 10 MG TABS, Take 1 tablet by mouth at bedtime., Disp: , Rfl:  .  Multiple Vitamin (MULITIVITAMIN WITH MINERALS) TABS, Take 1 tablet by mouth daily., Disp: , Rfl:    I spent  *** minutes on this encounter, including face to face time and non-face to face time spent reviewing records, charting, coordinating care, etc.   Julian Hy, DO [facility] 10/11/2019 8:42 AM

## 2019-11-18 ENCOUNTER — Other Ambulatory Visit (HOSPITAL_COMMUNITY): Payer: Medicare HMO

## 2019-11-25 DIAGNOSIS — I10 Essential (primary) hypertension: Secondary | ICD-10-CM | POA: Diagnosis not present

## 2019-11-25 DIAGNOSIS — E7801 Familial hypercholesterolemia: Secondary | ICD-10-CM | POA: Diagnosis not present

## 2019-12-26 DIAGNOSIS — E7849 Other hyperlipidemia: Secondary | ICD-10-CM | POA: Diagnosis not present

## 2019-12-26 DIAGNOSIS — I10 Essential (primary) hypertension: Secondary | ICD-10-CM | POA: Diagnosis not present

## 2019-12-26 DIAGNOSIS — R7303 Prediabetes: Secondary | ICD-10-CM | POA: Diagnosis not present

## 2020-01-02 DIAGNOSIS — I639 Cerebral infarction, unspecified: Secondary | ICD-10-CM | POA: Diagnosis not present

## 2020-01-02 DIAGNOSIS — R7303 Prediabetes: Secondary | ICD-10-CM | POA: Diagnosis not present

## 2020-01-02 DIAGNOSIS — E7800 Pure hypercholesterolemia, unspecified: Secondary | ICD-10-CM | POA: Diagnosis not present

## 2020-01-02 DIAGNOSIS — I10 Essential (primary) hypertension: Secondary | ICD-10-CM | POA: Diagnosis not present

## 2020-01-02 DIAGNOSIS — R739 Hyperglycemia, unspecified: Secondary | ICD-10-CM | POA: Diagnosis not present

## 2020-01-05 DIAGNOSIS — Z1331 Encounter for screening for depression: Secondary | ICD-10-CM | POA: Diagnosis not present

## 2020-01-05 DIAGNOSIS — I10 Essential (primary) hypertension: Secondary | ICD-10-CM | POA: Diagnosis not present

## 2020-01-05 DIAGNOSIS — E785 Hyperlipidemia, unspecified: Secondary | ICD-10-CM | POA: Diagnosis not present

## 2020-01-05 DIAGNOSIS — Z6824 Body mass index (BMI) 24.0-24.9, adult: Secondary | ICD-10-CM | POA: Diagnosis not present

## 2020-01-05 DIAGNOSIS — M545 Low back pain: Secondary | ICD-10-CM | POA: Diagnosis not present

## 2020-01-05 DIAGNOSIS — Z0001 Encounter for general adult medical examination with abnormal findings: Secondary | ICD-10-CM | POA: Diagnosis not present

## 2020-01-05 DIAGNOSIS — Z1389 Encounter for screening for other disorder: Secondary | ICD-10-CM | POA: Diagnosis not present

## 2020-01-05 DIAGNOSIS — I639 Cerebral infarction, unspecified: Secondary | ICD-10-CM | POA: Diagnosis not present

## 2020-01-11 DIAGNOSIS — Z0001 Encounter for general adult medical examination with abnormal findings: Secondary | ICD-10-CM | POA: Diagnosis not present

## 2020-01-11 DIAGNOSIS — E7800 Pure hypercholesterolemia, unspecified: Secondary | ICD-10-CM | POA: Diagnosis not present

## 2020-01-11 DIAGNOSIS — Z6824 Body mass index (BMI) 24.0-24.9, adult: Secondary | ICD-10-CM | POA: Diagnosis not present

## 2020-01-11 DIAGNOSIS — R739 Hyperglycemia, unspecified: Secondary | ICD-10-CM | POA: Diagnosis not present

## 2020-01-11 DIAGNOSIS — I10 Essential (primary) hypertension: Secondary | ICD-10-CM | POA: Diagnosis not present

## 2020-02-24 DIAGNOSIS — R7303 Prediabetes: Secondary | ICD-10-CM | POA: Diagnosis not present

## 2020-02-24 DIAGNOSIS — E7849 Other hyperlipidemia: Secondary | ICD-10-CM | POA: Diagnosis not present

## 2020-02-24 DIAGNOSIS — I10 Essential (primary) hypertension: Secondary | ICD-10-CM | POA: Diagnosis not present

## 2020-05-04 DIAGNOSIS — Z23 Encounter for immunization: Secondary | ICD-10-CM | POA: Diagnosis not present

## 2020-11-29 DIAGNOSIS — Z6824 Body mass index (BMI) 24.0-24.9, adult: Secondary | ICD-10-CM | POA: Diagnosis not present

## 2020-11-29 DIAGNOSIS — M79643 Pain in unspecified hand: Secondary | ICD-10-CM | POA: Diagnosis not present

## 2020-11-29 DIAGNOSIS — M6530 Trigger finger, unspecified finger: Secondary | ICD-10-CM | POA: Diagnosis not present

## 2020-12-31 DIAGNOSIS — R7303 Prediabetes: Secondary | ICD-10-CM | POA: Diagnosis not present

## 2020-12-31 DIAGNOSIS — I10 Essential (primary) hypertension: Secondary | ICD-10-CM | POA: Diagnosis not present

## 2020-12-31 DIAGNOSIS — Z13220 Encounter for screening for lipoid disorders: Secondary | ICD-10-CM | POA: Diagnosis not present

## 2021-01-03 DIAGNOSIS — S39012A Strain of muscle, fascia and tendon of lower back, initial encounter: Secondary | ICD-10-CM | POA: Diagnosis not present

## 2021-01-03 DIAGNOSIS — Z6824 Body mass index (BMI) 24.0-24.9, adult: Secondary | ICD-10-CM | POA: Diagnosis not present

## 2021-01-03 DIAGNOSIS — Z23 Encounter for immunization: Secondary | ICD-10-CM | POA: Diagnosis not present

## 2021-01-03 DIAGNOSIS — I10 Essential (primary) hypertension: Secondary | ICD-10-CM | POA: Diagnosis not present

## 2021-01-03 DIAGNOSIS — I639 Cerebral infarction, unspecified: Secondary | ICD-10-CM | POA: Diagnosis not present

## 2021-01-03 DIAGNOSIS — E785 Hyperlipidemia, unspecified: Secondary | ICD-10-CM | POA: Diagnosis not present

## 2021-01-03 DIAGNOSIS — Z0001 Encounter for general adult medical examination with abnormal findings: Secondary | ICD-10-CM | POA: Diagnosis not present

## 2021-03-06 DIAGNOSIS — M1990 Unspecified osteoarthritis, unspecified site: Secondary | ICD-10-CM | POA: Diagnosis not present

## 2021-03-06 DIAGNOSIS — E785 Hyperlipidemia, unspecified: Secondary | ICD-10-CM | POA: Diagnosis not present

## 2021-03-06 DIAGNOSIS — Z8249 Family history of ischemic heart disease and other diseases of the circulatory system: Secondary | ICD-10-CM | POA: Diagnosis not present

## 2021-03-06 DIAGNOSIS — I10 Essential (primary) hypertension: Secondary | ICD-10-CM | POA: Diagnosis not present

## 2021-03-06 DIAGNOSIS — Z008 Encounter for other general examination: Secondary | ICD-10-CM | POA: Diagnosis not present

## 2021-03-06 DIAGNOSIS — K219 Gastro-esophageal reflux disease without esophagitis: Secondary | ICD-10-CM | POA: Diagnosis not present

## 2021-03-06 DIAGNOSIS — K08409 Partial loss of teeth, unspecified cause, unspecified class: Secondary | ICD-10-CM | POA: Diagnosis not present

## 2021-03-06 DIAGNOSIS — G629 Polyneuropathy, unspecified: Secondary | ICD-10-CM | POA: Diagnosis not present

## 2021-03-06 DIAGNOSIS — G8929 Other chronic pain: Secondary | ICD-10-CM | POA: Diagnosis not present

## 2021-03-06 DIAGNOSIS — I69331 Monoplegia of upper limb following cerebral infarction affecting right dominant side: Secondary | ICD-10-CM | POA: Diagnosis not present

## 2021-04-26 DIAGNOSIS — Z23 Encounter for immunization: Secondary | ICD-10-CM | POA: Diagnosis not present

## 2021-10-22 DIAGNOSIS — R69 Illness, unspecified: Secondary | ICD-10-CM | POA: Diagnosis not present

## 2021-10-22 DIAGNOSIS — I69343 Monoplegia of lower limb following cerebral infarction affecting right non-dominant side: Secondary | ICD-10-CM | POA: Diagnosis not present

## 2021-10-22 DIAGNOSIS — Z823 Family history of stroke: Secondary | ICD-10-CM | POA: Diagnosis not present

## 2021-10-22 DIAGNOSIS — M1990 Unspecified osteoarthritis, unspecified site: Secondary | ICD-10-CM | POA: Diagnosis not present

## 2021-10-22 DIAGNOSIS — Z008 Encounter for other general examination: Secondary | ICD-10-CM | POA: Diagnosis not present

## 2021-10-22 DIAGNOSIS — Z8249 Family history of ischemic heart disease and other diseases of the circulatory system: Secondary | ICD-10-CM | POA: Diagnosis not present

## 2021-10-22 DIAGNOSIS — Z833 Family history of diabetes mellitus: Secondary | ICD-10-CM | POA: Diagnosis not present

## 2021-10-22 DIAGNOSIS — G8929 Other chronic pain: Secondary | ICD-10-CM | POA: Diagnosis not present

## 2021-10-22 DIAGNOSIS — E785 Hyperlipidemia, unspecified: Secondary | ICD-10-CM | POA: Diagnosis not present

## 2021-10-22 DIAGNOSIS — Z7982 Long term (current) use of aspirin: Secondary | ICD-10-CM | POA: Diagnosis not present

## 2021-10-22 DIAGNOSIS — I10 Essential (primary) hypertension: Secondary | ICD-10-CM | POA: Diagnosis not present

## 2022-01-02 DIAGNOSIS — E785 Hyperlipidemia, unspecified: Secondary | ICD-10-CM | POA: Diagnosis not present

## 2022-01-02 DIAGNOSIS — E7801 Familial hypercholesterolemia: Secondary | ICD-10-CM | POA: Diagnosis not present

## 2022-01-02 DIAGNOSIS — E7800 Pure hypercholesterolemia, unspecified: Secondary | ICD-10-CM | POA: Diagnosis not present

## 2022-01-02 DIAGNOSIS — I10 Essential (primary) hypertension: Secondary | ICD-10-CM | POA: Diagnosis not present

## 2022-01-02 DIAGNOSIS — J449 Chronic obstructive pulmonary disease, unspecified: Secondary | ICD-10-CM | POA: Diagnosis not present

## 2022-01-02 DIAGNOSIS — E039 Hypothyroidism, unspecified: Secondary | ICD-10-CM | POA: Diagnosis not present

## 2022-01-02 DIAGNOSIS — R739 Hyperglycemia, unspecified: Secondary | ICD-10-CM | POA: Diagnosis not present

## 2022-01-02 DIAGNOSIS — E1165 Type 2 diabetes mellitus with hyperglycemia: Secondary | ICD-10-CM | POA: Diagnosis not present

## 2022-01-07 DIAGNOSIS — I10 Essential (primary) hypertension: Secondary | ICD-10-CM | POA: Diagnosis not present

## 2022-01-07 DIAGNOSIS — Z6823 Body mass index (BMI) 23.0-23.9, adult: Secondary | ICD-10-CM | POA: Diagnosis not present

## 2022-01-07 DIAGNOSIS — M545 Low back pain, unspecified: Secondary | ICD-10-CM | POA: Diagnosis not present

## 2022-01-07 DIAGNOSIS — I639 Cerebral infarction, unspecified: Secondary | ICD-10-CM | POA: Diagnosis not present

## 2022-01-07 DIAGNOSIS — Z0001 Encounter for general adult medical examination with abnormal findings: Secondary | ICD-10-CM | POA: Diagnosis not present

## 2022-01-07 DIAGNOSIS — E785 Hyperlipidemia, unspecified: Secondary | ICD-10-CM | POA: Diagnosis not present

## 2022-03-03 DIAGNOSIS — H2513 Age-related nuclear cataract, bilateral: Secondary | ICD-10-CM | POA: Diagnosis not present

## 2022-03-11 DIAGNOSIS — Z01818 Encounter for other preprocedural examination: Secondary | ICD-10-CM | POA: Diagnosis not present

## 2022-03-11 DIAGNOSIS — H25811 Combined forms of age-related cataract, right eye: Secondary | ICD-10-CM | POA: Diagnosis not present

## 2022-03-11 DIAGNOSIS — H25812 Combined forms of age-related cataract, left eye: Secondary | ICD-10-CM | POA: Diagnosis not present

## 2022-04-17 DIAGNOSIS — H269 Unspecified cataract: Secondary | ICD-10-CM | POA: Diagnosis not present

## 2022-04-17 DIAGNOSIS — H25812 Combined forms of age-related cataract, left eye: Secondary | ICD-10-CM | POA: Diagnosis not present

## 2022-04-17 DIAGNOSIS — H2511 Age-related nuclear cataract, right eye: Secondary | ICD-10-CM | POA: Diagnosis not present

## 2022-04-17 DIAGNOSIS — H2512 Age-related nuclear cataract, left eye: Secondary | ICD-10-CM | POA: Diagnosis not present

## 2022-04-23 DIAGNOSIS — Z23 Encounter for immunization: Secondary | ICD-10-CM | POA: Diagnosis not present

## 2022-05-08 DIAGNOSIS — H25811 Combined forms of age-related cataract, right eye: Secondary | ICD-10-CM | POA: Diagnosis not present

## 2022-05-08 DIAGNOSIS — H269 Unspecified cataract: Secondary | ICD-10-CM | POA: Diagnosis not present

## 2022-06-18 DIAGNOSIS — H52223 Regular astigmatism, bilateral: Secondary | ICD-10-CM | POA: Diagnosis not present

## 2022-07-09 DIAGNOSIS — R739 Hyperglycemia, unspecified: Secondary | ICD-10-CM | POA: Diagnosis not present

## 2022-08-04 DIAGNOSIS — R030 Elevated blood-pressure reading, without diagnosis of hypertension: Secondary | ICD-10-CM | POA: Diagnosis not present

## 2022-08-04 DIAGNOSIS — Z6823 Body mass index (BMI) 23.0-23.9, adult: Secondary | ICD-10-CM | POA: Diagnosis not present

## 2022-08-04 DIAGNOSIS — C4490 Unspecified malignant neoplasm of skin, unspecified: Secondary | ICD-10-CM | POA: Diagnosis not present

## 2022-08-26 DIAGNOSIS — C44219 Basal cell carcinoma of skin of left ear and external auricular canal: Secondary | ICD-10-CM | POA: Diagnosis not present

## 2022-08-26 DIAGNOSIS — L821 Other seborrheic keratosis: Secondary | ICD-10-CM | POA: Diagnosis not present

## 2022-08-26 DIAGNOSIS — L570 Actinic keratosis: Secondary | ICD-10-CM | POA: Diagnosis not present

## 2022-09-04 DIAGNOSIS — C44219 Basal cell carcinoma of skin of left ear and external auricular canal: Secondary | ICD-10-CM | POA: Diagnosis not present

## 2023-01-05 DIAGNOSIS — Z0001 Encounter for general adult medical examination with abnormal findings: Secondary | ICD-10-CM | POA: Diagnosis not present

## 2023-01-05 DIAGNOSIS — R739 Hyperglycemia, unspecified: Secondary | ICD-10-CM | POA: Diagnosis not present

## 2023-01-05 DIAGNOSIS — E039 Hypothyroidism, unspecified: Secondary | ICD-10-CM | POA: Diagnosis not present

## 2023-01-05 DIAGNOSIS — R7303 Prediabetes: Secondary | ICD-10-CM | POA: Diagnosis not present

## 2023-01-05 DIAGNOSIS — E7801 Familial hypercholesterolemia: Secondary | ICD-10-CM | POA: Diagnosis not present

## 2023-01-05 DIAGNOSIS — E559 Vitamin D deficiency, unspecified: Secondary | ICD-10-CM | POA: Diagnosis not present

## 2023-01-05 DIAGNOSIS — I10 Essential (primary) hypertension: Secondary | ICD-10-CM | POA: Diagnosis not present

## 2023-01-13 DIAGNOSIS — Z0001 Encounter for general adult medical examination with abnormal findings: Secondary | ICD-10-CM | POA: Diagnosis not present

## 2023-01-13 DIAGNOSIS — I10 Essential (primary) hypertension: Secondary | ICD-10-CM | POA: Diagnosis not present

## 2023-01-13 DIAGNOSIS — I639 Cerebral infarction, unspecified: Secondary | ICD-10-CM | POA: Diagnosis not present

## 2023-01-13 DIAGNOSIS — Z6823 Body mass index (BMI) 23.0-23.9, adult: Secondary | ICD-10-CM | POA: Diagnosis not present

## 2023-01-13 DIAGNOSIS — E785 Hyperlipidemia, unspecified: Secondary | ICD-10-CM | POA: Diagnosis not present

## 2023-01-13 DIAGNOSIS — M545 Low back pain, unspecified: Secondary | ICD-10-CM | POA: Diagnosis not present

## 2023-01-13 DIAGNOSIS — Z23 Encounter for immunization: Secondary | ICD-10-CM | POA: Diagnosis not present

## 2023-01-26 DIAGNOSIS — I639 Cerebral infarction, unspecified: Secondary | ICD-10-CM | POA: Diagnosis not present

## 2023-01-26 DIAGNOSIS — E785 Hyperlipidemia, unspecified: Secondary | ICD-10-CM | POA: Diagnosis not present

## 2023-01-26 DIAGNOSIS — I10 Essential (primary) hypertension: Secondary | ICD-10-CM | POA: Diagnosis not present

## 2023-01-26 DIAGNOSIS — Z6823 Body mass index (BMI) 23.0-23.9, adult: Secondary | ICD-10-CM | POA: Diagnosis not present

## 2023-01-26 DIAGNOSIS — M545 Low back pain, unspecified: Secondary | ICD-10-CM | POA: Diagnosis not present

## 2023-01-30 ENCOUNTER — Encounter: Payer: Self-pay | Admitting: Internal Medicine

## 2023-01-30 ENCOUNTER — Other Ambulatory Visit: Payer: Self-pay

## 2023-01-30 NOTE — Progress Notes (Unsigned)
Updated medical history and medications.

## 2023-02-02 ENCOUNTER — Encounter: Payer: Self-pay | Admitting: Internal Medicine

## 2023-02-02 ENCOUNTER — Ambulatory Visit: Payer: Medicare HMO | Attending: Internal Medicine | Admitting: Internal Medicine

## 2023-02-02 VITALS — BP 132/72 | HR 82 | Ht 70.0 in | Wt 169.0 lb

## 2023-02-02 DIAGNOSIS — R0609 Other forms of dyspnea: Secondary | ICD-10-CM | POA: Diagnosis not present

## 2023-02-02 DIAGNOSIS — R079 Chest pain, unspecified: Secondary | ICD-10-CM | POA: Diagnosis not present

## 2023-02-02 DIAGNOSIS — E7849 Other hyperlipidemia: Secondary | ICD-10-CM

## 2023-02-02 DIAGNOSIS — E785 Hyperlipidemia, unspecified: Secondary | ICD-10-CM | POA: Insufficient documentation

## 2023-02-02 NOTE — Progress Notes (Signed)
Cardiology Office Note  Date: 02/02/2023   ID: Johnathan Reynolds, Johnathan Reynolds Dec 21, 1940, MRN [id number]  PCP:  Donetta Potts, MD  Cardiologist:  Marjo Bicker, MD Electrophysiologist:  None   Reason for Office Visit: Evaluation of chest pain and SOB   History of Present Illness: Johnathan Reynolds is a 82 y.o. male known to have history of CVA, HTN, HLD was referred to cardiology clinic for evaluation of chest pain and SOB.  Patient has ongoing history of chest pain and SOB for the last 2 to 3 weeks.  Chest pains are substernal, occurs at rest and exertion, last for few minutes and has been ongoing for few times per week.  Associated with SOB and sometimes can occur isolated as well.  No dizziness, syncope, palpitations or leg swelling.  Denied smoking cigarettes.  Underwent stress test a few years ago that was normal.  Echo from 2016 showed normal LVEF.   Past Medical History:  Diagnosis Date   Cerebral infarction [facility])    DDD (degenerative disc disease), cervical    DJD (degenerative joint disease)    Dyslipidemia    Hypertension    Nerve pain    Stroke (HCC) 11/13/2014   embolic, evaluated at [facility] hospital   Subdural hematoma (HCC) 10/27/2011   presumed spontaneous, though he had had a fall 2 months prior with head injury    Past Surgical History:  Procedure Laterality Date   CRANIOTOMY  11/15/2011   Procedure: CRANIOTOMY HEMATOMA EVACUATION SUBDURAL;  Surgeon: Clydene Fake, MD;  Location: [facility] NEURO ORS;  Service: Neurosurgery;  Laterality: N/A;  Bilateral [place] Drainage for subdural hematoma   FOOT SURGERY     LAMINECTOMY     twice, most recently 1996   NASAL SINUS SURGERY     VIDEO BRONCHOSCOPY Bilateral 04/15/2016   Procedure: VIDEO BRONCHOSCOPY WITHOUT FLUORO;  Surgeon: Coralyn Helling, MD;  Location: [facility] ENDOSCOPY;  Service: Cardiopulmonary;  Laterality: Bilateral;    Current Outpatient Medications  Medication Sig Dispense Refill   acetaminophen (TYLENOL) 650  MG CR tablet Take 1,300 mg by mouth every 8 (eight) hours as needed for pain.      aspirin EC 81 MG tablet Take 81 mg by mouth daily. Swallow whole.     Calcium Carb-Cholecalciferol 600-800 MG-UNIT TABS Take 1 tablet by mouth daily.     gabapentin (NEURONTIN) 300 MG capsule Take 300 mg by mouth in the morning and take 600 mg by mouth in the evening     losartan (COZAAR) 25 MG tablet Take 1 tablet by mouth every evening.     Melatonin 10 MG TABS Take 1 tablet by mouth at bedtime.     Multiple Vitamin (MULITIVITAMIN WITH MINERALS) TABS Take 1 tablet by mouth daily.     rosuvastatin (CRESTOR) 10 MG tablet Take 10 mg by mouth daily.     No current facility-administered medications for this visit.   Allergies:  Patient has no known allergies.   Social History: The patient  reports that he has never smoked. He has never used smokeless tobacco. He reports current alcohol use. He reports that he does not use drugs.   Family History: The patient's family history includes CAD in his mother and another family member; COPD in his father; Diabetes Mellitus II in his maternal grandmother.   ROS:  Please see the history of present illness. Otherwise, complete review of systems is positive for none  All other systems are reviewed and negative.  Physical Exam: VS:  BP 132/72   Pulse 82   Ht 5\' 10"  (1.778 m)   Wt 169 lb (76.7 kg)   SpO2 97%   BMI 24.25 kg/m , BMI Body mass index is 24.25 kg/m.  Wt Readings from Last 3 Encounters:  02/02/23 169 lb (76.7 kg)  04/15/16 170 lb (77.1 kg)  12/11/14 172 lb 12.8 oz (78.4 kg)    General: Patient appears comfortable at rest. HEENT: Conjunctiva and lids normal, oropharynx clear with moist mucosa. Neck: Supple, no elevated JVP or carotid bruits, no thyromegaly. Lungs: Clear to auscultation, nonlabored breathing at rest. Cardiac: Regular rate and rhythm, no S3 or significant systolic murmur, no pericardial rub. Abdomen: Soft, nontender, no hepatomegaly,  bowel sounds present, no guarding or rebound. Extremities: No pitting edema, distal pulses 2+. Skin: Warm and dry. Musculoskeletal: No kyphosis. Neuropsychiatric: Alert and oriented x3, affect grossly appropriate.  Recent Labwork: No results found for requested labs within last 365 days.  No results found for: "CHOL", "TRIG", "HDL", "CHOLHDL", "VLDL", "LDLCALC", "LDLDIRECT"   Assessment and Plan:  # Chest pain # DOE -Patient started to have 2 to 3-week history of rest and exertional chest pains associated with DOE. Lasting for a few minutes and occurs few times per week. No prior history of MI/PCI/CABG. Had a stress test a few years ago that was normal and echo from 2016 showed normal LVEF. Obtain 2D echocardiogram and Lexiscan.  # History of CVA in 2016 -Continue cardioprotective medications with aspirin 81 mg once daily and rosuvastatin 10 mg nightly.  # HTN, controlled -Continue losartan 25 mg once daily, HTN per PCP.  # HLD, unknown values -Continue rosuvastatin 10 mg nightly, goal LDL less than 70.  I have spent a total of 45 minutes with patient reviewing chart, EKGs, labs and examining patient as well as establishing an assessment and plan that was discussed with the patient.  > 50% of time was spent in direct patient care.    Medication Adjustments/Labs and Tests Ordered: Current medicines are reviewed at length with the patient today.  Concerns regarding medicines are outlined above.   Tests Ordered: Orders Placed This Encounter  Procedures   NM Myocar Multi W/Spect W/Wall Motion / EF   ECHOCARDIOGRAM COMPLETE    Medication Changes: No orders of the defined types were placed in this encounter.   Disposition:  Follow up  pending results  Signed Ryan Palermo Verne Spurr, MD, 02/02/2023 3:12 PM    [facility] [address] [place], [place], [place] [address]

## 2023-02-02 NOTE — Patient Instructions (Signed)
Medication Instructions:  Your physician recommends that you continue on your current medications as directed. Please refer to the Current Medication list given to you today.   Labwork: None  Testing/Procedures: Your physician has requested that you have an echocardiogram. Echocardiography is a painless test that uses sound waves to create images of your heart. It provides your doctor with information about the size and shape of your heart and how well your heart's chambers and valves are working. This procedure takes approximately one hour. There are no restrictions for this procedure. Please do NOT wear cologne, perfume, aftershave, or lotions (deodorant is allowed). Please arrive 15 minutes prior to your appointment time.  Your physician has requested that you have a lexiscan myoview. For further information please visit [email or web address]. Please follow instruction sheet, as given.   Follow-Up: Your physician recommends that you schedule a follow-up appointment in: Pending Results  Any Other Special Instructions Will Be Listed Below (If Applicable).  If you need a refill on your cardiac medications before your next appointment, please call your pharmacy.  

## 2023-02-03 ENCOUNTER — Telehealth: Payer: Self-pay | Admitting: Internal Medicine

## 2023-02-03 NOTE — Telephone Encounter (Signed)
Patient states he is returning a call, but does not know who called or what it was regarding. 

## 2023-02-09 DIAGNOSIS — Z6823 Body mass index (BMI) 23.0-23.9, adult: Secondary | ICD-10-CM | POA: Diagnosis not present

## 2023-02-09 DIAGNOSIS — R0600 Dyspnea, unspecified: Secondary | ICD-10-CM | POA: Diagnosis not present

## 2023-02-09 DIAGNOSIS — S39012A Strain of muscle, fascia and tendon of lower back, initial encounter: Secondary | ICD-10-CM | POA: Diagnosis not present

## 2023-02-09 DIAGNOSIS — I639 Cerebral infarction, unspecified: Secondary | ICD-10-CM | POA: Diagnosis not present

## 2023-02-09 DIAGNOSIS — I10 Essential (primary) hypertension: Secondary | ICD-10-CM | POA: Diagnosis not present

## 2023-02-09 DIAGNOSIS — E785 Hyperlipidemia, unspecified: Secondary | ICD-10-CM | POA: Diagnosis not present

## 2023-02-10 ENCOUNTER — Ambulatory Visit: Payer: Medicare HMO | Attending: Internal Medicine

## 2023-02-10 DIAGNOSIS — R079 Chest pain, unspecified: Secondary | ICD-10-CM

## 2023-02-10 DIAGNOSIS — R0609 Other forms of dyspnea: Secondary | ICD-10-CM

## 2023-02-10 LAB — ECHOCARDIOGRAM COMPLETE
AR max vel: 3.02 cm2
AV Area VTI: 3.18 cm2
AV Area mean vel: 3.3 cm2
AV Mean grad: 4 mmHg
AV Peak grad: 7.7 mmHg
Ao pk vel: 1.39 m/s
Area-P 1/2: 2.8 cm2
Calc EF: 63.2 %
MV VTI: 3 cm2
S' Lateral: 2.6 cm
Single Plane A2C EF: 70.3 %
Single Plane A4C EF: 56.9 %

## 2023-02-12 ENCOUNTER — Encounter (HOSPITAL_COMMUNITY): Payer: Self-pay

## 2023-02-12 ENCOUNTER — Encounter (HOSPITAL_BASED_OUTPATIENT_CLINIC_OR_DEPARTMENT_OTHER)
Admission: RE | Admit: 2023-02-12 | Discharge: 2023-02-12 | Disposition: A | Discharge status: Home / Self Care | Payer: Medicare HMO | Source: Ambulatory Visit | Attending: Internal Medicine | Admitting: Internal Medicine

## 2023-02-12 ENCOUNTER — Ambulatory Visit (HOSPITAL_COMMUNITY)
Admission: RE | Admit: 2023-02-12 | Discharge: 2023-02-12 | Disposition: A | Discharge status: Home / Self Care | Payer: Medicare HMO | Source: Ambulatory Visit | Attending: Internal Medicine | Admitting: Internal Medicine

## 2023-02-12 DIAGNOSIS — R0609 Other forms of dyspnea: Secondary | ICD-10-CM | POA: Insufficient documentation

## 2023-02-12 DIAGNOSIS — R079 Chest pain, unspecified: Secondary | ICD-10-CM | POA: Insufficient documentation

## 2023-02-12 LAB — NM MYOCAR MULTI W/SPECT W/WALL MOTION / EF
Base ST Depression (mm): 0 mm
LV dias vol: 56 mL (ref 62–150)
LV sys vol: 25 mL
Nuc Stress EF: 55 %
Peak HR: 102 {beats}/min
RATE: 0.4
Rest HR: 69 {beats}/min
Rest Nuclear Isotope Dose: 10.8 mCi
SDS: 0
SRS: 15
SSS: 15
ST Depression (mm): 0 mm
Stress Nuclear Isotope Dose: 31 mCi
TID: 1.26

## 2023-02-12 MED ORDER — TECHNETIUM TC 99M TETROFOSMIN IV KIT
30.0000 | PACK | Freq: Once | INTRAVENOUS | Status: AC | PRN
  Administered 2023-02-12: 31 via INTRAVENOUS

## 2023-02-12 MED ORDER — REGADENOSON 0.4 MG/5ML IV SOLN
INTRAVENOUS | Status: AC
  Administered 2023-02-12: 0.4 mg via INTRAVENOUS
  Filled 2023-02-12: qty 5

## 2023-02-12 MED ORDER — SODIUM CHLORIDE FLUSH 0.9 % IV SOLN
INTRAVENOUS | Status: AC
  Administered 2023-02-12: 10 mL via INTRAVENOUS
  Filled 2023-02-12: qty 10

## 2023-02-12 MED ORDER — TECHNETIUM TC 99M TETROFOSMIN IV KIT
10.0000 | PACK | Freq: Once | INTRAVENOUS | Status: AC | PRN
  Administered 2023-02-12: 10.8 via INTRAVENOUS

## 2023-02-18 ENCOUNTER — Telehealth: Payer: Self-pay | Admitting: Internal Medicine

## 2023-02-18 NOTE — Telephone Encounter (Signed)
Patient is requesting call back to get results.

## 2023-02-18 NOTE — Telephone Encounter (Signed)
Called patient to let him know we would call him once you have resulted test and send a copy to PCP

## 2023-02-20 DIAGNOSIS — Z6823 Body mass index (BMI) 23.0-23.9, adult: Secondary | ICD-10-CM | POA: Diagnosis not present

## 2023-02-20 DIAGNOSIS — Z1331 Encounter for screening for depression: Secondary | ICD-10-CM | POA: Diagnosis not present

## 2023-02-20 DIAGNOSIS — R0600 Dyspnea, unspecified: Secondary | ICD-10-CM | POA: Diagnosis not present

## 2023-02-20 DIAGNOSIS — E785 Hyperlipidemia, unspecified: Secondary | ICD-10-CM | POA: Diagnosis not present

## 2023-02-20 DIAGNOSIS — I639 Cerebral infarction, unspecified: Secondary | ICD-10-CM | POA: Diagnosis not present

## 2023-02-20 DIAGNOSIS — I10 Essential (primary) hypertension: Secondary | ICD-10-CM | POA: Diagnosis not present

## 2023-02-20 DIAGNOSIS — Z1389 Encounter for screening for other disorder: Secondary | ICD-10-CM | POA: Diagnosis not present

## 2023-02-23 ENCOUNTER — Ambulatory Visit: Payer: Medicare HMO | Admitting: Internal Medicine

## 2023-02-23 NOTE — Telephone Encounter (Signed)
Patient is following up, requesting to discuss stress test results again.

## 2023-02-27 NOTE — Telephone Encounter (Signed)
Patient informed and verbalized understanding of plan. Copy sent to PCP 

## 2023-03-26 DIAGNOSIS — E785 Hyperlipidemia, unspecified: Secondary | ICD-10-CM | POA: Diagnosis not present

## 2023-03-26 DIAGNOSIS — R0600 Dyspnea, unspecified: Secondary | ICD-10-CM | POA: Diagnosis not present

## 2023-03-26 DIAGNOSIS — I10 Essential (primary) hypertension: Secondary | ICD-10-CM | POA: Diagnosis not present

## 2023-03-26 DIAGNOSIS — I639 Cerebral infarction, unspecified: Secondary | ICD-10-CM | POA: Diagnosis not present

## 2023-03-26 DIAGNOSIS — Z6823 Body mass index (BMI) 23.0-23.9, adult: Secondary | ICD-10-CM | POA: Diagnosis not present

## 2023-05-08 DIAGNOSIS — Z23 Encounter for immunization: Secondary | ICD-10-CM | POA: Diagnosis not present

## 2023-08-31 DIAGNOSIS — L820 Inflamed seborrheic keratosis: Secondary | ICD-10-CM | POA: Diagnosis not present

## 2023-08-31 DIAGNOSIS — D225 Melanocytic nevi of trunk: Secondary | ICD-10-CM | POA: Diagnosis not present

## 2023-08-31 DIAGNOSIS — L578 Other skin changes due to chronic exposure to nonionizing radiation: Secondary | ICD-10-CM | POA: Diagnosis not present

## 2023-08-31 DIAGNOSIS — L570 Actinic keratosis: Secondary | ICD-10-CM | POA: Diagnosis not present

## 2023-08-31 DIAGNOSIS — L209 Atopic dermatitis, unspecified: Secondary | ICD-10-CM | POA: Diagnosis not present

## 2024-01-25 DIAGNOSIS — M19042 Primary osteoarthritis, left hand: Secondary | ICD-10-CM | POA: Diagnosis not present

## 2024-01-25 DIAGNOSIS — Z6822 Body mass index (BMI) 22.0-22.9, adult: Secondary | ICD-10-CM | POA: Diagnosis not present

## 2024-01-25 DIAGNOSIS — M65342 Trigger finger, left ring finger: Secondary | ICD-10-CM | POA: Diagnosis not present

## 2024-02-02 DIAGNOSIS — H5203 Hypermetropia, bilateral: Secondary | ICD-10-CM | POA: Diagnosis not present

## 2024-02-02 DIAGNOSIS — H524 Presbyopia: Secondary | ICD-10-CM | POA: Diagnosis not present

## 2024-02-02 DIAGNOSIS — H52223 Regular astigmatism, bilateral: Secondary | ICD-10-CM | POA: Diagnosis not present

## 2024-05-19 DIAGNOSIS — Z23 Encounter for immunization: Secondary | ICD-10-CM | POA: Diagnosis not present

## 2024-06-24 NOTE — Progress Notes (Signed)
 [facility]                                          MRN: [id number]   06/24/2024   The [facility] Quality Team Specialist reviewed this patient medical record for the purposes of chart review for care gap closure. The following were reviewed: chart review for care gap closure-controlling blood pressure.    [facility] Quality Team
